# Patient Record
Sex: Male | Born: 1950 | Race: White | Hispanic: No | Marital: Married | Smoking: Never smoker
Health system: Southern US, Community
[De-identification: ages and names within clinical notes are randomized; demographics above are authoritative.]

## PROBLEM LIST (undated history)

## (undated) DIAGNOSIS — N529 Male erectile dysfunction, unspecified: Secondary | ICD-10-CM

## (undated) DIAGNOSIS — K760 Fatty (change of) liver, not elsewhere classified: Secondary | ICD-10-CM

## (undated) DIAGNOSIS — K635 Polyp of colon: Secondary | ICD-10-CM

## (undated) DIAGNOSIS — M2550 Pain in unspecified joint: Secondary | ICD-10-CM

## (undated) DIAGNOSIS — I219 Acute myocardial infarction, unspecified: Secondary | ICD-10-CM

## (undated) DIAGNOSIS — E785 Hyperlipidemia, unspecified: Secondary | ICD-10-CM

## (undated) DIAGNOSIS — I10 Essential (primary) hypertension: Secondary | ICD-10-CM

## (undated) DIAGNOSIS — I2510 Atherosclerotic heart disease of native coronary artery without angina pectoris: Secondary | ICD-10-CM

## (undated) DIAGNOSIS — K2970 Gastritis, unspecified, without bleeding: Secondary | ICD-10-CM

## (undated) DIAGNOSIS — K219 Gastro-esophageal reflux disease without esophagitis: Secondary | ICD-10-CM

## (undated) DIAGNOSIS — M1990 Unspecified osteoarthritis, unspecified site: Secondary | ICD-10-CM

## (undated) DIAGNOSIS — A048 Other specified bacterial intestinal infections: Secondary | ICD-10-CM

## (undated) DIAGNOSIS — Z87442 Personal history of urinary calculi: Secondary | ICD-10-CM

## (undated) DIAGNOSIS — I871 Compression of vein: Secondary | ICD-10-CM

## (undated) HISTORY — DX: Gastritis, unspecified, without bleeding: K29.70

## (undated) HISTORY — PX: PILONIDAL CYST EXCISION: SHX744

## (undated) HISTORY — DX: Acute myocardial infarction, unspecified: I21.9

## (undated) HISTORY — DX: Other specified bacterial intestinal infections: A04.8

---

## 2007-04-12 ENCOUNTER — Ambulatory Visit: Payer: Self-pay | Admitting: Internal Medicine

## 2010-05-29 ENCOUNTER — Ambulatory Visit: Payer: Self-pay | Admitting: Internal Medicine

## 2010-08-12 ENCOUNTER — Ambulatory Visit: Payer: Self-pay | Admitting: Internal Medicine

## 2011-02-28 IMAGING — CR DG CHEST 2V
1 series · 2 of 2 positions shown · non-contrast
Comparison: none

REASON FOR EXAM: chest congestion
COMMENTS:

[Series 1: view not recorded · 0.17mm/px · 2 of 2 slices shown]
[im 1/2]
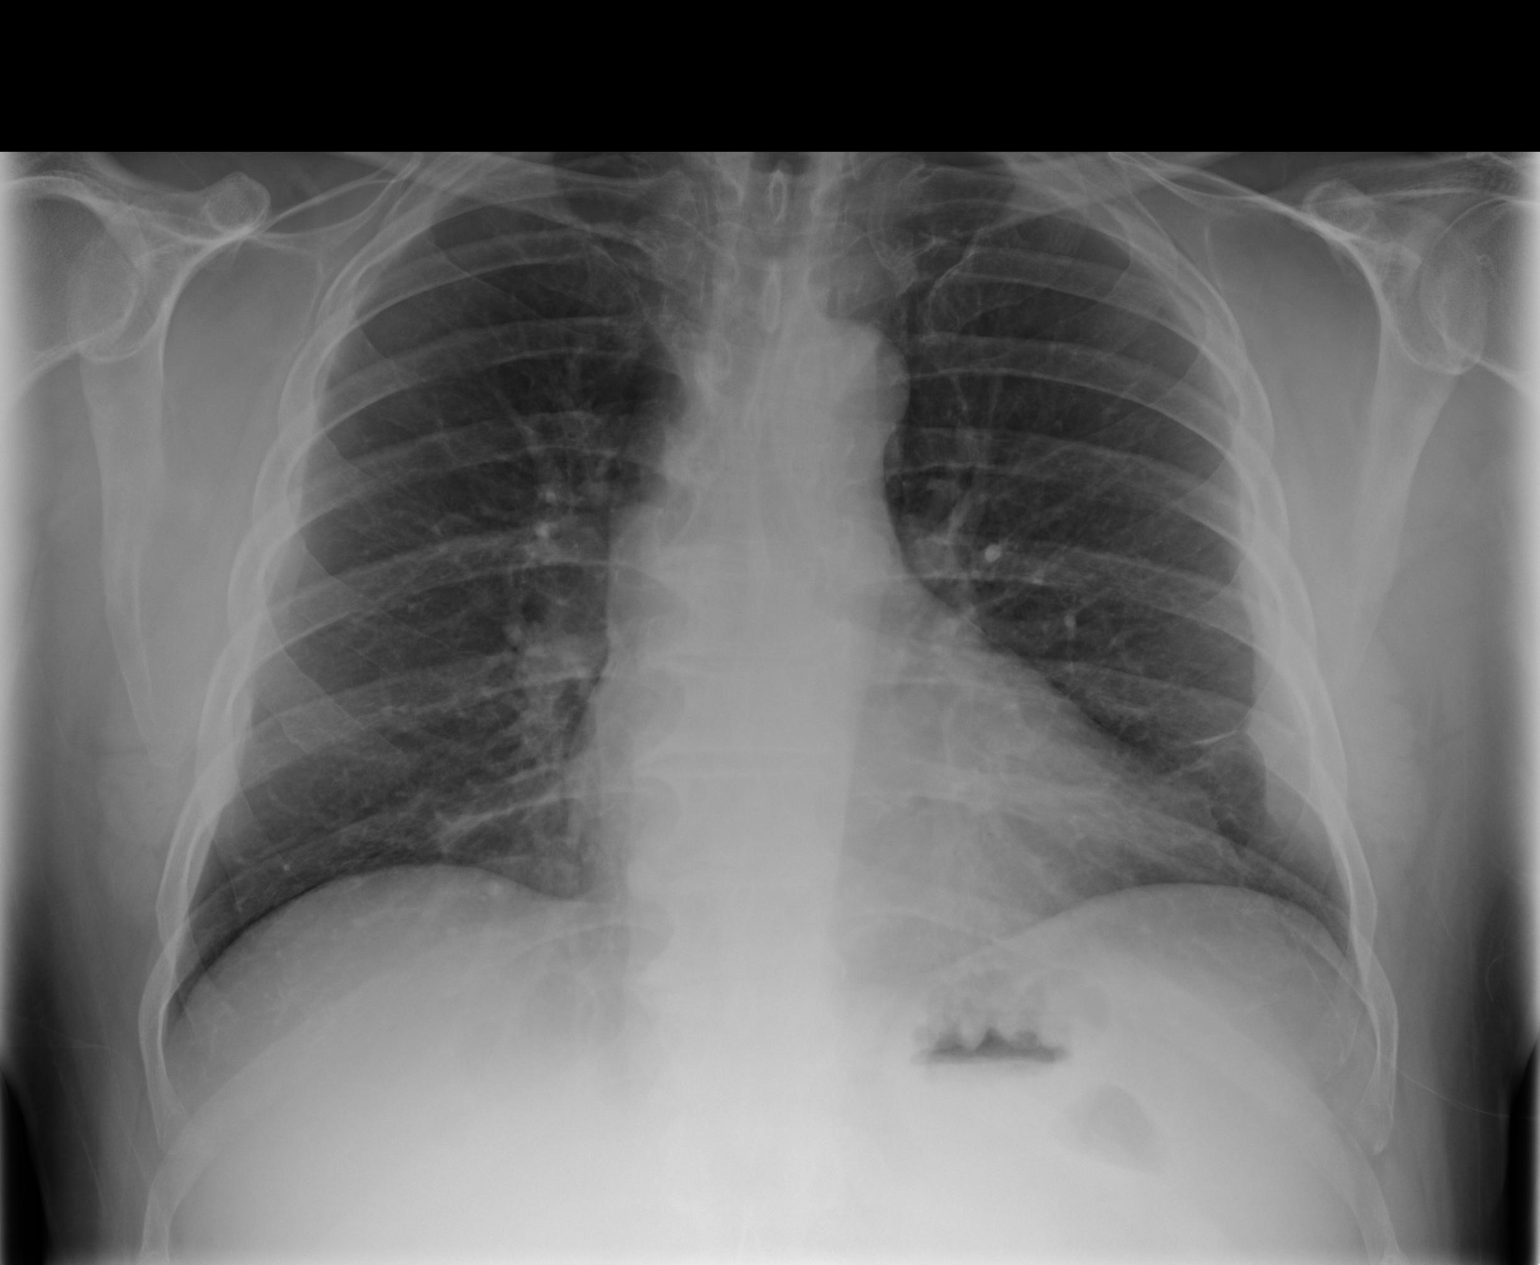
[im 2/2]
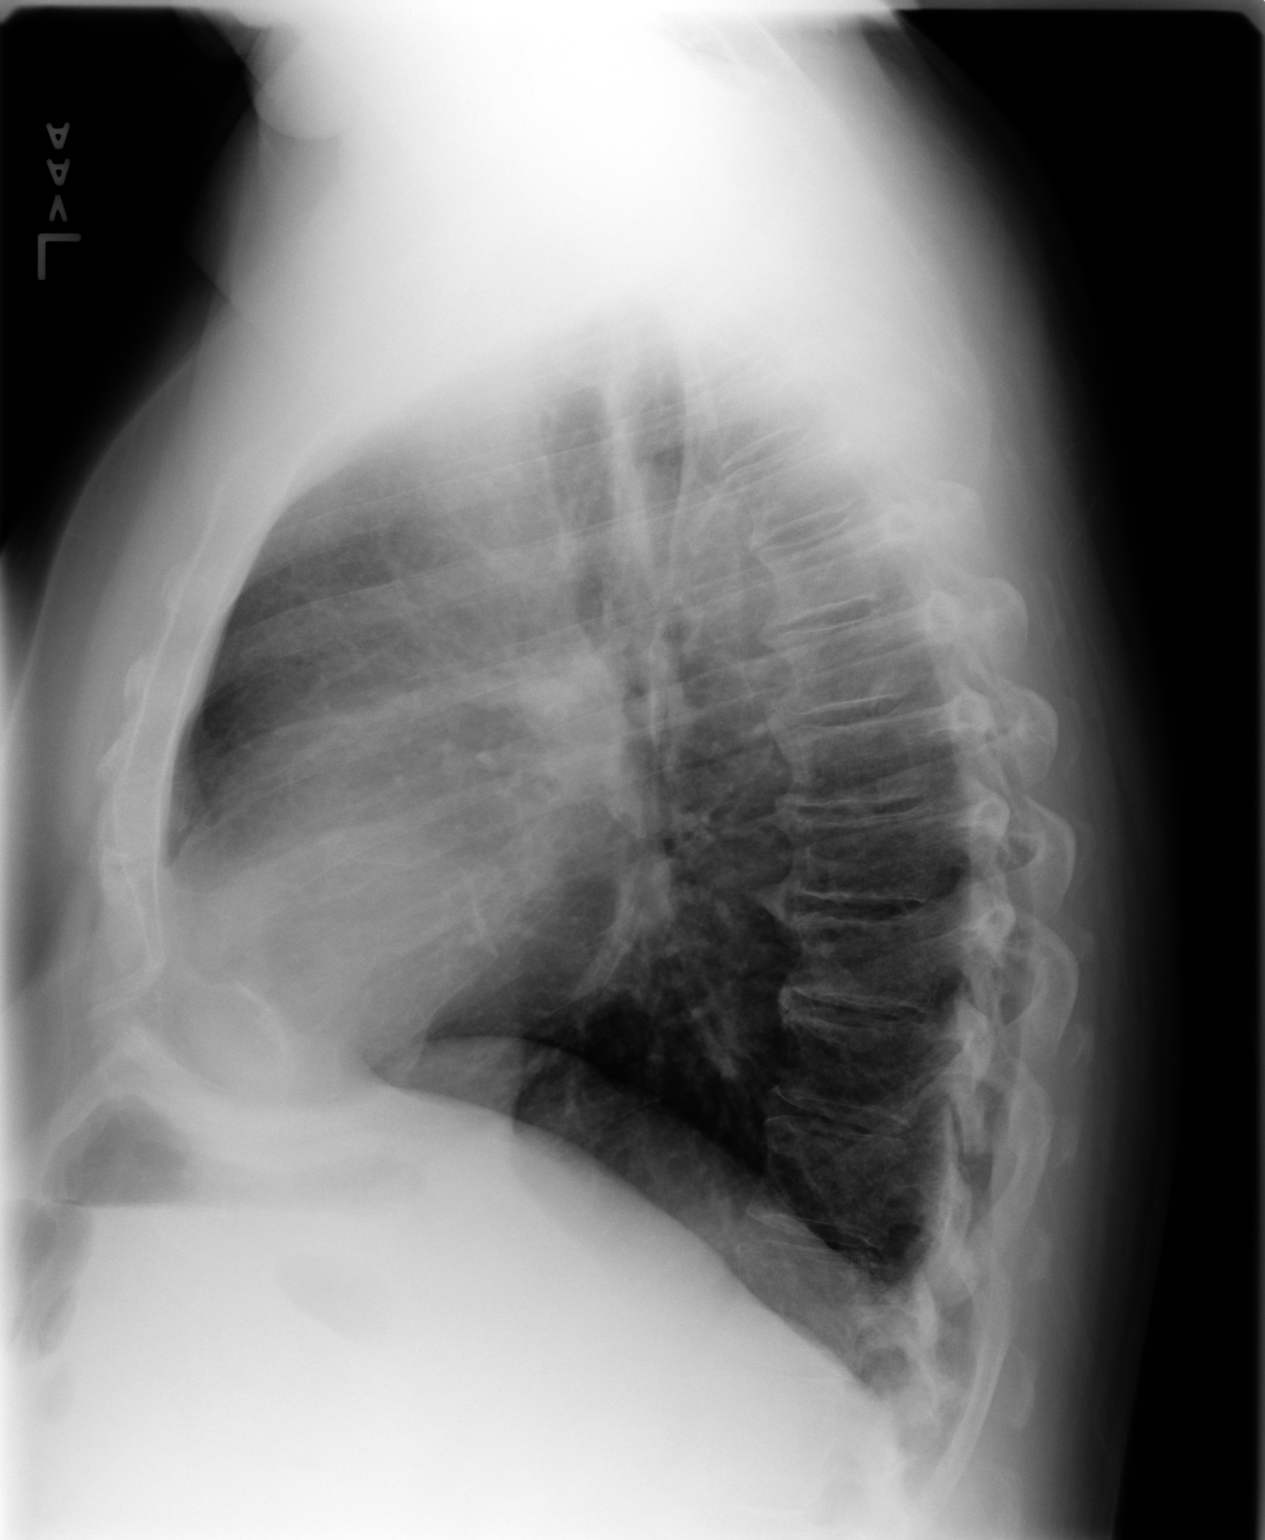

[2 of 2 positions shown; findings below may reference images not displayed]

PROCEDURE:     MDR - MDR CHEST PA(OR AP) AND LATERAL  - May 29, 2010 [DATE]

RESULT:     There is no previous exam for comparison.

The lungs are clear. The heart and pulmonary vessels are normal. The bony
and mediastinal structures are unremarkable. There is no effusion. There is
no pneumothorax or evidence of congestive failure.
IMPRESSION: No acute cardiopulmonary disease.

## 2012-12-13 ENCOUNTER — Ambulatory Visit: Payer: Self-pay | Admitting: Physician Assistant

## 2015-11-29 ENCOUNTER — Other Ambulatory Visit: Payer: Self-pay | Admitting: Nurse Practitioner

## 2015-11-29 DIAGNOSIS — K625 Hemorrhage of anus and rectum: Secondary | ICD-10-CM

## 2015-11-29 DIAGNOSIS — R1032 Left lower quadrant pain: Secondary | ICD-10-CM

## 2015-11-29 DIAGNOSIS — R1031 Right lower quadrant pain: Secondary | ICD-10-CM

## 2015-11-30 ENCOUNTER — Ambulatory Visit
Admission: RE | Admit: 2015-11-30 | Discharge: 2015-11-30 | Disposition: A | Discharge status: Home / Self Care | Payer: BC Managed Care – PPO | Source: Ambulatory Visit | Attending: Nurse Practitioner | Admitting: Nurse Practitioner

## 2015-11-30 DIAGNOSIS — R1031 Right lower quadrant pain: Secondary | ICD-10-CM

## 2015-11-30 DIAGNOSIS — I700 Atherosclerosis of aorta: Secondary | ICD-10-CM | POA: Insufficient documentation

## 2015-11-30 DIAGNOSIS — K625 Hemorrhage of anus and rectum: Secondary | ICD-10-CM

## 2015-11-30 DIAGNOSIS — K760 Fatty (change of) liver, not elsewhere classified: Secondary | ICD-10-CM | POA: Diagnosis not present

## 2015-11-30 DIAGNOSIS — R1032 Left lower quadrant pain: Secondary | ICD-10-CM

## 2015-11-30 DIAGNOSIS — I2510 Atherosclerotic heart disease of native coronary artery without angina pectoris: Secondary | ICD-10-CM | POA: Diagnosis not present

## 2015-11-30 DIAGNOSIS — K8020 Calculus of gallbladder without cholecystitis without obstruction: Secondary | ICD-10-CM | POA: Diagnosis not present

## 2015-11-30 MED ORDER — IOPAMIDOL (ISOVUE-300) INJECTION 61%
100.0000 mL | Freq: Once | INTRAVENOUS | Status: AC | PRN
  Administered 2015-11-30: 100 mL via INTRAVENOUS

## 2016-02-17 ENCOUNTER — Encounter: Payer: Self-pay | Admitting: *Deleted

## 2016-02-20 ENCOUNTER — Ambulatory Visit
Admission: RE | Admit: 2016-02-20 | Discharge: 2016-02-20 | Disposition: A | Discharge status: Home / Self Care | Payer: BC Managed Care – PPO | Source: Ambulatory Visit | Attending: Unknown Physician Specialty | Admitting: Unknown Physician Specialty

## 2016-02-20 ENCOUNTER — Encounter: Payer: Self-pay | Admitting: *Deleted

## 2016-02-20 ENCOUNTER — Encounter
Admission: RE | Disposition: A | Discharge status: Home / Self Care | Payer: Self-pay | Source: Ambulatory Visit | Attending: Unknown Physician Specialty

## 2016-02-20 ENCOUNTER — Ambulatory Visit: Payer: BC Managed Care – PPO | Admitting: Anesthesiology

## 2016-02-20 DIAGNOSIS — D123 Benign neoplasm of transverse colon: Secondary | ICD-10-CM | POA: Insufficient documentation

## 2016-02-20 DIAGNOSIS — K219 Gastro-esophageal reflux disease without esophagitis: Secondary | ICD-10-CM | POA: Diagnosis not present

## 2016-02-20 DIAGNOSIS — I2510 Atherosclerotic heart disease of native coronary artery without angina pectoris: Secondary | ICD-10-CM | POA: Insufficient documentation

## 2016-02-20 DIAGNOSIS — K3189 Other diseases of stomach and duodenum: Secondary | ICD-10-CM | POA: Insufficient documentation

## 2016-02-20 DIAGNOSIS — D120 Benign neoplasm of cecum: Secondary | ICD-10-CM | POA: Diagnosis not present

## 2016-02-20 DIAGNOSIS — I252 Old myocardial infarction: Secondary | ICD-10-CM | POA: Diagnosis not present

## 2016-02-20 DIAGNOSIS — D122 Benign neoplasm of ascending colon: Secondary | ICD-10-CM | POA: Diagnosis not present

## 2016-02-20 DIAGNOSIS — E785 Hyperlipidemia, unspecified: Secondary | ICD-10-CM | POA: Diagnosis not present

## 2016-02-20 DIAGNOSIS — Z7982 Long term (current) use of aspirin: Secondary | ICD-10-CM | POA: Diagnosis not present

## 2016-02-20 DIAGNOSIS — K766 Portal hypertension: Secondary | ICD-10-CM | POA: Diagnosis not present

## 2016-02-20 DIAGNOSIS — M1990 Unspecified osteoarthritis, unspecified site: Secondary | ICD-10-CM | POA: Diagnosis not present

## 2016-02-20 DIAGNOSIS — I10 Essential (primary) hypertension: Secondary | ICD-10-CM | POA: Insufficient documentation

## 2016-02-20 DIAGNOSIS — K2970 Gastritis, unspecified, without bleeding: Secondary | ICD-10-CM | POA: Diagnosis not present

## 2016-02-20 DIAGNOSIS — K621 Rectal polyp: Secondary | ICD-10-CM | POA: Diagnosis not present

## 2016-02-20 HISTORY — DX: Acute myocardial infarction, unspecified: I21.9

## 2016-02-20 HISTORY — PX: ESOPHAGOGASTRODUODENOSCOPY (EGD) WITH PROPOFOL: SHX5813

## 2016-02-20 HISTORY — PX: COLONOSCOPY WITH PROPOFOL: SHX5780

## 2016-02-20 HISTORY — DX: Male erectile dysfunction, unspecified: N52.9

## 2016-02-20 HISTORY — DX: Hyperlipidemia, unspecified: E78.5

## 2016-02-20 HISTORY — DX: Compression of vein: I87.1

## 2016-02-20 HISTORY — DX: Essential (primary) hypertension: I10

## 2016-02-20 HISTORY — DX: Gastro-esophageal reflux disease without esophagitis: K21.9

## 2016-02-20 HISTORY — DX: Unspecified osteoarthritis, unspecified site: M19.90

## 2016-02-20 HISTORY — DX: Fatty (change of) liver, not elsewhere classified: K76.0

## 2016-02-20 HISTORY — DX: Polyp of colon: K63.5

## 2016-02-20 HISTORY — DX: Atherosclerotic heart disease of native coronary artery without angina pectoris: I25.10

## 2016-02-20 HISTORY — DX: Pain in unspecified joint: M25.50

## 2016-02-20 SURGERY — COLONOSCOPY WITH PROPOFOL
Anesthesia: General

## 2016-02-20 MED ORDER — PROPOFOL 10 MG/ML IV BOLUS
INTRAVENOUS | Status: DC | PRN
  Administered 2016-02-20 (×2): 20 mg via INTRAVENOUS
  Administered 2016-02-20: 60 mg via INTRAVENOUS

## 2016-02-20 MED ORDER — LIDOCAINE HCL (CARDIAC) 20 MG/ML IV SOLN
INTRAVENOUS | Status: DC | PRN
  Administered 2016-02-20: 100 mg via INTRAVENOUS

## 2016-02-20 MED ORDER — PROPOFOL 500 MG/50ML IV EMUL
INTRAVENOUS | Status: DC | PRN
Start: 2016-02-20 — End: 2016-02-20
  Administered 2016-02-20: 140 ug/kg/min via INTRAVENOUS

## 2016-02-20 MED ORDER — SODIUM CHLORIDE 0.9 % IV SOLN
INTRAVENOUS | Status: DC
  Administered 2016-02-20: 13:00:00 via INTRAVENOUS

## 2016-02-20 MED ORDER — SODIUM CHLORIDE 0.9 % IV SOLN: INTRAVENOUS | Status: DC

## 2016-02-20 NOTE — Transfer of Care (Signed)
Immediate Anesthesia Transfer of Care Note  Patient: Ricky Stewart  Procedure(s) Performed: Procedure(s): COLONOSCOPY WITH PROPOFOL (N/A) ESOPHAGOGASTRODUODENOSCOPY (EGD) WITH PROPOFOL (N/A)  Patient Location: PACU  Anesthesia Type:General  Level of Consciousness: awake, alert  and oriented  Airway & Oxygen Therapy: Patient Spontanous Breathing and Patient connected to nasal cannula oxygen  Post-op Assessment: Report given to RN and Post -op Vital signs reviewed and stable  Post vital signs: Reviewed and stable  Last Vitals:  Vitals:   02/20/16 1248  BP: 135/89  Pulse: 70  Resp: 18  Temp: 36.6 C    Last Pain:  Vitals:   02/20/16 1249  TempSrc:   PainSc: 3          Complications: No apparent anesthesia complications

## 2016-02-20 NOTE — H&P (Signed)
   Primary Care Physician:  Mertie Clause, MD Primary Gastroenterologist:  Dr. Vira Agar  Pre-Procedure History & Physical: HPI:  Ricky Stewart is a 65 y.o. male is here for an endoscopy and colonoscopy.   Past Medical History:  Diagnosis Date  . Arthralgia   . Arthritis   . Colon polyp   . Coronary artery disease   . ED (erectile dysfunction)   . Fatty liver   . GERD (gastroesophageal reflux disease)   . Hepatic vein stenosis   . Hyperlipidemia   . Hypertension   . Myocardial infarction     History reviewed. No pertinent surgical history.  Prior to Admission medications   Medication Sig Start Date End Date Taking? Authorizing Provider  aspirin EC 81 MG tablet Take 81 mg by mouth daily.   Yes Historical Provider, MD  atorvastatin (LIPITOR) 40 MG tablet Take 40 mg by mouth daily.   Yes Historical Provider, MD  losartan (COZAAR) 100 MG tablet Take 100 mg by mouth daily.   Yes Historical Provider, MD  niacin 100 MG tablet Take 100 mg by mouth at bedtime.   Yes Historical Provider, MD  Omega-3 Fatty Acids (FISH OIL PO) Take by mouth.   Yes Historical Provider, MD  omeprazole (PRILOSEC) 20 MG capsule Take 20 mg by mouth daily.    Historical Provider, MD    Allergies as of 02/01/2016  . (No Known Allergies)    History reviewed. No pertinent family history.  Social History   Social History  . Marital status: Married    Spouse name: N/A  . Number of children: N/A  . Years of education: N/A   Occupational History  . Not on file.   Social History Main Topics  . Smoking status: Never Smoker  . Smokeless tobacco: Never Used  . Alcohol use 0.6 oz/week    1 Cans of beer per week  . Drug use: No  . Sexual activity: Not on file   Other Topics Concern  . Not on file   Social History Narrative  . No narrative on file    Review of Systems: See HPI, otherwise negative ROS  Physical Exam: BP 135/89   Pulse 70   Temp 97.9 F (36.6 C) (Tympanic)   Resp 18   Ht  5\' 8"  (1.727 m)   Wt 99.8 kg (220 lb)   SpO2 98%   BMI 33.45 kg/m  General:   Alert,  pleasant and cooperative in NAD Head:  Normocephalic and atraumatic. Neck:  Supple; no masses or thyromegaly. Lungs:  Clear throughout to auscultation.    Heart:  Regular rate and rhythm. Abdomen:  Soft, nontender and nondistended. Normal bowel sounds, without guarding, and without rebound.   Neurologic:  Alert and  oriented x4;  grossly normal neurologically.  Impression/Plan: Ilean Skill is here for an endoscopy and colonoscopy to be performed for rectal bleeding, heme pos stool, PH adenomatous polyps, GERD.  Risks, benefits, limitations, and alternatives regarding  endoscopy and colonoscopy have been reviewed with the patient.  Questions have been answered.  All parties agreeable.   Gaylyn Cheers, MD  02/20/2016, 1:20 PM

## 2016-02-20 NOTE — Op Note (Addendum)
[facility] Gastroenterology Patient Name: Ricky Stewart Procedure Date: 02/20/2016 1:24 PM MRN: [id number] Account #: [id number] Date of Birth: 1950-09-14 Admit Type: Outpatient Age: 65 Room: [facility] ENDO ROOM 4 Gender: Male Note Status: Finalized Procedure:            Colonoscopy Indications:          Heme positive stool, Rectal bleeding Providers:            Manya Silvas, MD Referring MD:         Rogue Jury, MD (Referring MD) Medicines:            Propofol per Anesthesia Complications:        No immediate complications. Procedure:            Pre-Anesthesia Assessment:                       - After reviewing the risks and benefits, the patient                        was deemed in satisfactory condition to undergo the                        procedure.                       After obtaining informed consent, the colonoscope was                        passed under direct vision. Throughout the procedure,                        the patient's blood pressure, pulse, and oxygen                        saturations were monitored continuously. The                        Colonoscope was introduced through the anus and                        advanced to the the cecum, identified by appendiceal                        orifice and ileocecal valve. The colonoscopy was                        performed without difficulty. The patient tolerated the                        procedure well. The quality of the bowel preparation                        was excellent. Findings:      Two sessile polyps were found in the cecum. The polyps were diminutive       in size. These polyps were removed with a jumbo cold forceps. Resection       and retrieval were complete.      A diminutive polyp was found in the ascending colon. The polyp was       sessile. The polyp was removed with a jumbo cold forceps. Resection and  retrieval were complete.      A diminutive polyp was found in the  transverse colon. The polyp was       sessile. The polyp was removed with a cold snare. Resection and       retrieval were complete.      A 6-8 mm polyp was found in the ano-rectum. The polyp was       semi-pedunculated. It was attached in an area of internal hemorrhoids.       Will need an anorectal surgeon to remove this. Impression:           - Two diminutive polyps in the cecum, removed with a                        jumbo cold forceps. Resected and retrieved.                       - One diminutive polyp in the ascending colon, removed                        with a jumbo cold forceps. Resected and retrieved.                       - One diminutive polyp in the transverse colon, removed                        with a cold snare. Resected and retrieved.                       - One 8 mm polyp in the rectum. Recommendation:       - Await pathology results. Manya Silvas, MD 02/20/2016 2:09:11 PM This report has been signed electronically. Number of Addenda: 0 Note Initiated On: 02/20/2016 1:24 PM Scope Withdrawal Time: 0 hours 17 minutes 46 seconds  Total Procedure Duration: 0 hours 20 minutes 55 seconds       [facility]

## 2016-02-20 NOTE — Anesthesia Preprocedure Evaluation (Addendum)
Anesthesia Evaluation  Patient identified by MRN, date of birth, ID band Patient awake    Reviewed: Allergy & Precautions, NPO status , Patient's Chart, lab work & pertinent test results  History of Anesthesia Complications Negative for: history of anesthetic complications  Airway Mallampati: II  TM Distance: >3 FB Neck ROM: Full    Dental  (+) Partial Lower   Pulmonary neg pulmonary ROS, neg sleep apnea, neg COPD,    breath sounds clear to auscultation- rhonchi (-) wheezing      Cardiovascular hypertension, Pt. on medications (-) angina+ CAD and + Past MI (1999)  (-) Cardiac Stents and (-) CABG  Rhythm:Regular Rate:Normal - Systolic murmurs and - Diastolic murmurs  Cardiac Cath [facility] 01/2007 RCA PDA 90%. D1 80%. D2 80%. Normal EF. 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 No date: Hyperlipidemia No date: Hypertension No date: Myocardial infarction   Reproductive/Obstetrics                            Anesthesia Physical Anesthesia Plan  ASA: III  Anesthesia Plan: General   Post-op Pain Management:    Induction: Intravenous  Airway Management Planned: Natural Airway  Additional Equipment:   Intra-op Plan:   Post-operative Plan:   Informed Consent: I have reviewed the patients History and Physical, chart,  labs and discussed the procedure including the risks, benefits and alternatives for the proposed anesthesia with the patient or authorized representative who has indicated his/her understanding and acceptance.   Dental advisory given  Plan Discussed with: CRNA and Anesthesiologist  Anesthesia Plan Comments:         Anesthesia Quick Evaluation

## 2016-02-20 NOTE — Op Note (Signed)
[facility] Gastroenterology Patient Name: Ricky Stewart Procedure Date: 02/20/2016 1:24 PM MRN: [id number] Account #: [id number] Date of Birth: 1950-05-05 Admit Type: Outpatient Age: 65 Room: [facility] ENDO ROOM 4 Gender: Male Note Status: Finalized Procedure:            Upper GI endoscopy Indications:          Heartburn, Heme positive stool Providers:            Manya Silvas, MD Referring MD:         Rogue Jury, MD (Referring MD) Medicines:            Propofol per Anesthesia Complications:        No immediate complications. Procedure:            Pre-Anesthesia Assessment:                       - After reviewing the risks and benefits, the patient                        was deemed in satisfactory condition to undergo the                        procedure.                       After obtaining informed consent, the endoscope was                        passed under direct vision. Throughout the procedure,                        the patient's blood pressure, pulse, and oxygen                        saturations were monitored continuously. The Endoscope                        was introduced through the mouth, and advanced to the                        second part of duodenum. The upper GI endoscopy was                        accomplished without difficulty. The patient tolerated                        the procedure well. Findings:      The examined esophagus was normal.      Diffuse mild inflammation characterized by erythema and granularity was       found in the gastric body and in the gastric antrum. Biopsies were taken       with a cold forceps for histology. Biopsies were taken with a cold       forceps for Helicobacter pylori testing.      Mild portal hypertensive gastropathy was found in the gastric body.      The examined duodenum was normal. Impression:           - Normal esophagus.                       - Gastritis. Biopsied.                       -  Portal hypertensive gastropathy.                       - Normal examined duodenum. Recommendation:       - Await pathology results. Manya Silvas, MD 02/20/2016 1:37:30 PM This report has been signed electronically. Number of Addenda: 0 Note Initiated On: 02/20/2016 1:24 PM      [facility]

## 2016-02-21 ENCOUNTER — Encounter: Payer: Self-pay | Admitting: Unknown Physician Specialty

## 2016-02-21 NOTE — Anesthesia Postprocedure Evaluation (Signed)
Anesthesia Post Note  Patient: Ricky Stewart  Procedure(s) Performed: Procedure(s) (LRB): COLONOSCOPY WITH PROPOFOL (N/A) ESOPHAGOGASTRODUODENOSCOPY (EGD) WITH PROPOFOL (N/A)  Patient location during evaluation: Endoscopy Anesthesia Type: General Level of consciousness: awake and alert and oriented Pain management: pain level controlled Vital Signs Assessment: post-procedure vital signs reviewed and stable Respiratory status: spontaneous breathing, nonlabored ventilation and respiratory function stable Cardiovascular status: blood pressure returned to baseline and stable Postop Assessment: no signs of nausea or vomiting Anesthetic complications: no    Last Vitals:  Vitals:   02/20/16 1406 02/20/16 1436  BP: (!) 134/99 (!) 124/94  Pulse:    Resp: 16   Temp: (!) 36 C     Last Pain:  Vitals:   02/20/16 1406  TempSrc: Tympanic  PainSc:                  [name]

## 2016-02-22 LAB — SURGICAL PATHOLOGY

## 2016-03-01 ENCOUNTER — Encounter: Payer: Self-pay | Admitting: General Surgery

## 2016-03-01 ENCOUNTER — Ambulatory Visit (INDEPENDENT_AMBULATORY_CARE_PROVIDER_SITE_OTHER): Payer: BC Managed Care – PPO | Admitting: General Surgery

## 2016-03-01 VITALS — BP 128/74 | HR 68 | Resp 14 | Ht 68.0 in | Wt 227.0 lb

## 2016-03-01 DIAGNOSIS — A048 Other specified bacterial intestinal infections: Secondary | ICD-10-CM | POA: Insufficient documentation

## 2016-03-01 DIAGNOSIS — K620 Anal polyp: Secondary | ICD-10-CM | POA: Diagnosis not present

## 2016-03-01 DIAGNOSIS — K621 Rectal polyp: Secondary | ICD-10-CM | POA: Diagnosis not present

## 2016-03-01 MED ORDER — CLARITHROMYCIN 500 MG PO TABS: 500.0000 mg | ORAL_TABLET | Freq: Two times a day (BID) | ORAL | 0 refills | Status: AC

## 2016-03-01 MED ORDER — OMEPRAZOLE 40 MG PO CPDR
40.0000 mg | DELAYED_RELEASE_CAPSULE | Freq: Two times a day (BID) | ORAL | 0 refills | Status: AC
Start: 2016-03-01 — End: ?

## 2016-03-01 MED ORDER — AMOXICILLIN 500 MG PO TABS: 1000.0000 mg | ORAL_TABLET | Freq: Two times a day (BID) | ORAL | 0 refills | Status: DC

## 2016-03-01 NOTE — Progress Notes (Signed)
Patient ID: Ricky Stewart, male   DOB: October 22, 1950, 65 y.o.   MRN: [id number]  Chief Complaint  Patient presents with  . Other    rectal polyp    HPI Ricky Stewart is a 64 y.o. male here today following up from a colonoscopy which was done on 02/20/2016 by Dr. Vira Agar. Patient states in the middle of the night he wakes up to move his bowels and having gassy pain .  He feels hurgery all the time.  Patient path report from his recently colonoscopy showed an several tubular adenomas.  Upper GI biopsy showed evidence of H. pylori. The patient reports he was not able to get in to see the PA from the GI physicians office until January. Moves his bowels four times daily. HPI  Past Medical History:  Diagnosis Date  . Arthralgia   . Arthritis   . Colon polyp   . Coronary artery disease   . ED (erectile dysfunction)   . Fatty liver   . Gastritis   . GERD (gastroesophageal reflux disease)   . Heart attack    18 years ago.  Marland Kitchen Helicobacter pylori infection   . Hepatic vein stenosis   . Hyperlipidemia   . Hypertension   . Myocardial infarction     Past Surgical History:  Procedure Laterality Date  . COLONOSCOPY WITH PROPOFOL N/A 02/20/2016   Procedure: COLONOSCOPY WITH PROPOFOL;  Surgeon: Manya Silvas, MD;  Location: [facility] ENDOSCOPY;  Service: Endoscopy;  Laterality: N/A;  . ESOPHAGOGASTRODUODENOSCOPY (EGD) WITH PROPOFOL N/A 02/20/2016   Procedure: ESOPHAGOGASTRODUODENOSCOPY (EGD) WITH PROPOFOL;  Surgeon: Manya Silvas, MD;  Location: [facility] ENDOSCOPY;  Service: Endoscopy;  Laterality: N/A;    No family history on file.  Social History Social History  Substance Use Topics  . Smoking status: Never Smoker  . Smokeless tobacco: Never Used  . Alcohol use 0.6 oz/week    1 Cans of beer per week    No Known Allergies  Current Outpatient Prescriptions  Medication Sig Dispense Refill  . aspirin EC 81 MG tablet Take 81 mg by mouth daily.    Marland Kitchen atorvastatin (LIPITOR) 40 MG tablet Take  40 mg by mouth daily.    Marland Kitchen losartan (COZAAR) 100 MG tablet Take 100 mg by mouth daily.    . niacin 100 MG tablet Take 100 mg by mouth at bedtime.    . Omega-3 Fatty Acids (FISH OIL PO) Take by mouth.    Marland Kitchen amoxicillin (AMOXIL) 500 MG tablet Take 2 tablets (1,000 mg total) by mouth 2 (two) times daily. 60 tablet 0  . clarithromycin (BIAXIN) 500 MG tablet Take 1 tablet (500 mg total) by mouth 2 (two) times daily. 28 tablet 0  . omeprazole (PRILOSEC) 40 MG capsule Take 1 capsule (40 mg total) by mouth 2 (two) times daily. 60 capsule 0   No current facility-administered medications for this visit.     Review of Systems Review of Systems  Blood pressure 128/74, pulse 68, resp. rate 14, height 5\' 8"  (1.727 m), weight 227 lb (103 kg).  Physical Exam Physical Exam  Constitutional: He is oriented to person, place, and time. He appears well-developed and well-nourished.  Eyes: Conjunctivae are normal. No scleral icterus.  Neck: Neck supple.  Cardiovascular: Normal rate, regular rhythm and normal heart sounds.   Pulmonary/Chest: Effort normal and breath sounds normal.  Abdominal: Soft. Normal appearance and bowel sounds are normal. There is hepatosplenomegaly. There is no hepatomegaly. There is no tenderness. No hernia.  Genitourinary: Rectal exam shows internal hemorrhoid.     Lymphadenopathy:    He has no cervical adenopathy.       Right: No inguinal adenopathy present.       Left: No inguinal and no supraclavicular adenopathy present.  Neurological: He is alert and oriented to person, place, and time.  Skin: Skin is warm and dry.    Data Reviewed Colonoscopy and upper endoscopy of 02/20/2016 reviewed.  Marland Kitchen STOMACH, ANTRUM AND BODY; COLD BIOPSY:  - ANTRAL AND OXYNTIC MUCOSA WITH MODERATE CHRONIC ACTIVE GASTRITIS,  HELICOBACT[employer] PYLORI [employer]IATED.  - NEGATIVE FOR DYSPLASIA AND MALIGNANCY.   B. COLON POLYP 2, CECUM; COLD BIOPSY:  - TUBULAR ADENOMAS (2).  - NEGATIVE FOR HIGH-GRADE  DYSPLASIA AND MALIGNANCY.   C. COLON POLYP, ASCENDING; COLD BIOPSY:  - TUBULAR ADENOMA.  - NEGATIVE FOR HIGH-GRADE DYSPLASIA AND MALIGNANCY.   D. COLON POLYP, TRANSVERSE; COLD SNARE/BIOPSY:  - TUBULAR ADENOMA.  - NEGATIVE FOR HIGH-GRADE DYSPLASIA AND MALIGNANCY.     Assessment    Documented H. pylori infection.  Anal polyp like lesions inflammatory rather than adenomatous adjacent to a large internal hemorrhoid.     Plan    The patient was prescribed twice a day Prilosec, Biaxin and amoxicillin for 2 weeks to treat his H. pylori infection.    Stop taking the Lipitor while taking the antibiotics.    To obtain pathology on the anal polyp, formal excision will be required. Depending on exam at the time of surgery, hemorrhoidal banding may be undertaken at the same setting.  Patient to be scheduled for polyp removal and hemorrhoid banding. The patient will be encouraged to make use of a daily fiber supplement.   Patient's surgery has been scheduled for 03-22-16 at [facility]. This patient has been asked to do one fleets enema one hour prior to arrival at [facility].   This information has been scribed by Gaspar Cola CMA.  [name] 03/01/2016, 7:55 PM

## 2016-03-01 NOTE — Patient Instructions (Addendum)
Stop taking the Lipitor while taking the antibiotics.   Patient to be scheduled for polyp removal and hemorrhoid banding. The patient will be encouraged to make use of a daily fiber supplement.

## 2016-03-13 ENCOUNTER — Encounter
Admission: RE | Admit: 2016-03-13 | Discharge: 2016-03-13 | Disposition: A | Discharge status: Home / Self Care | Payer: BC Managed Care – PPO | Source: Ambulatory Visit | Attending: General Surgery | Admitting: General Surgery

## 2016-03-13 HISTORY — DX: Personal history of urinary calculi: Z87.442

## 2016-03-13 NOTE — Pre-Procedure Instructions (Signed)
STRESS ECHO6/07/2011 [place]  [id number]  CARDIAC DIAGNOSTIC UNIT Date: 09/25/2011 13:00:00  Adult Male Age: 65  STRESS ECHO REPORTOutpatient  MPDC ---------------------------------------------------- MD1: Vevelyn Pat A STUDY: Stress Echo TAPE: [id number] BP: 138/98  ECHO: Yes DOPPLER: YesFILE: [id number]: HR: 70 COLOR: YesCONTRAST: Yes MACHINE: IE33 #7 Height: 68 in RV BIOPSY: No 3D: No SOUND QLTY: PoorWeight: 204 lbs  MEDIUM: DefinityBSA: 2.11,BMI: 31.00 ------------------------------------------------------------------------------  HISTORY:Coronary Artery Disease REASON: Assess LV function INDICATION: 780.8 - Generalized hyperhidrosis.  STRESS ECHOCARDIOGRAPHY ------------------------------------------------------   Protocol: Treadmill (Bruce) Drugs: None Target Heart Rate: 136Maximum Predicted Heart Rate: 161Resting EF: >55%  TYPE STAGETIMEHRBP DOSE COMMENTS -------- ----- --------- --- ------- ---------- ------------------------------ Baseline70 138/ 98Standing BP 165/84; HR 78 Stress 1180 sec.95 192/ 860 Definity 36ml via IV push Stress 2180 sec. 0 181/1010 RPE 13/20 Stress 3180 sec. 130 172/ 980 RPE 15/20 Stress 4 50 sec. 150 0/00 Recovery 11 min. 129 202/ 940 Recovery 22 min. 109 202/1110 Recovery 34 min.94 196/ 990 Recovery 46 min.90 187/ 990 Recovery  58 min.82 165/1040 Recovery 6 10 min.80 197/ 970  Stress Duration: 9.83 minutes.Max Stress H.R: 150  WALL SEGMENT CHANGES ---------------------------------------------------------   RestStress  --------------- --------------- Anterior Septum: NormalHyperkinetic Anterior Wall: NormalHyperkinetic  Lateral Wall: NormalHyperkinetic  Posterior Wall: NormalHyperkinetic Inferior Wall: NormalHyperkinetic Inferior Septum: NormalHyperkinetic  Apex: NormalHyperkinetic  MR: No MR N/A N/A N/A LVOT Vel: LVOT Jet: N/A  ADDITIONAL FINDINGS ----------------------------------------------------------  Chest Discomfort: None Arrhythmia: None Termination Reason: Fatigue  Adverse Effects: None and None  Complications: None  STRESS ECG RESULTS -----------------------------------------------------------   ECG Results: Normal  INTERPRETATION ---------------------------------------------------------------  Interpretation: Normal Stress Test. NO SIGNIFICANT VALVE ABNORMALITIES. Note: Excellent exercise tolerance with 13.4 METs max exertion Grade I diastolic dysfunction   (Report version 4.0)Interpreted and Electronically signed  Perform. by: Geryl Rankins, RRT, [facility] by: Leotis Shames, MD  Resp.Person: Geryl Rankins, RRT, [facility] On: 09/25/2011 18:08:32  Resp. Staff: Lars Mage, RN

## 2016-03-13 NOTE — Pre-Procedure Instructions (Signed)
Stewart, Ricky Coop, MD - 01/13/2016 1:20 PM EDT Formatting of this note may be different from the original. Ricky Stewart 01/13/2016 DOB: 1950/08/10 Age: 65 y.o. Clinic Note - Cardiovascular Ricky Stewart M.D. Ph.D. PRIMARY CARE PROVIDER:  Caleen Essex, III, MD  PATIENT PROFILE:  Ricky Stewart is a 65 y.o. male who comes in for consultation regarding coronary artery disease.  PROBLEM LIST:  Problem List Date Reviewed: 25-Jan-2016  Codes Priority Class Noted - Resolved  Pure hypercholesterolemia ICD-10-CM: E78.00 ICD-9-CM: 272.0 High Unknown - Present  Overview Addendum January 25, 2016 12:18 PM by Ricky Coop Povsic, MD  Sept 2017: Tchol 167, HDL 64, LDL 73, TG 153 07/2010: TC 153 TG 84 HDL 64 LDL 72   CAD (coronary artery disease) ICD-10-CM: I25.10 ICD-9-CM: 414.00 High Unknown - Present  Overview Signed 08/15/2011 2:40 PM by Ricky Canterbury, RN  a. 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 No intervention was performed. Stress Test ordered by Dr. Caryl Comes at [facility] in 2008 with Stewart protocol with peak heart rate of 148 and no EKG changes but mild mid inferolateral and epical lateral ischemia with an EF above 60.  b. Cardiac Cath [facility] 01/2007 RCA PDA 90%. D1 80%. D2 80%. Normal EF. Elected to treat these lesions medically   Essential hypertension ICD-10-CM: I10 ICD-9-CM: 401.9 Medium Unknown - Present  Fatty liver ICD-10-CM: K76.0 ICD-9-CM: 571.8 12/13/2015 - Present  Bilateral lower abdominal pain ICD-10-CM: R10.31, R10.32 ICD-9-CM: 789.03, 789.04 11/29/2015 - Present  Bright red rectal bleeding ICD-10-CM: K62.5 ICD-9-CM: 569.3 11/29/2015 - Present  Bowel habit changes ICD-10-CM: R19.4 ICD-9-CM: 787.99 11/29/2015 - Present  History of adenomatous polyp of colon ICD-10-CM: Z86.010 ICD-9-CM: V12.72 11/29/2015 - Present  Heme positive stool ICD-10-CM:  R19.5 ICD-9-CM: 792.1 11/29/2015 - Present  Acid reflux ICD-10-CM: K21.9 ICD-9-CM: 530.81 01/12/2014 - Present  Allergic rhinitis ICD-10-CM: J30.9 ICD-9-CM: 477.9 01/12/2014 - Present  Neck pain ICD-10-CM: M54.2 ICD-9-CM: 723.1 02/24/2013 - Present  Chronic low back pain ICD-10-CM: M54.5, G89.29 ICD-9-CM: 724.2, 338.29 12/07/2011 - Present  Routine general medical examination at a health care facility ICD-10-CM: Z00.00 ICD-9-CM: V70.0 09/10/2011 - Present  Vitamin D deficiency ICD-10-CM: E55.9 ICD-9-CM: 268.9 09/10/2011 - Present  Abnormal blood sugar ICD-10-CM: R73.09 ICD-9-CM: 790.29 09/10/2011 - Present  Night sweats ICD-10-CM: R61 ICD-9-CM: 780.8 09/10/2011 - Present  Arthralgia, unspecified ICD-10-CM: M25.50 ICD-9-CM: 719.40 Unknown - Present  ED (erectile dysfunction) ICD-10-CM: N52.9 ICD-9-CM: 607.84 Unknown - Present  Low testosterone ICD-10-CM: E29.1 ICD-9-CM: 257.2 Unknown - Present  RESOLVED: Abdominal pain, LLQ (left lower quadrant) ICD-10-CM: R10.32 ICD-9-CM: 789.04 12/13/2015 - 12/13/2015  RESOLVED: Adenomatous colon polyp ICD-10-CM: D12.6 ICD-9-CM: 211.3 09/10/2011 - 11/29/2015  RESOLVED: Routine general medical examination at a health care facility ICD-10-CM: Z00.00 ICD-9-CM: V70.0 07/25/2011 - 08/15/2011    HISTORY:  [name] was last seen in 2012. At that time he was doing well without angina for his coronary disease which was diagnosed in a cath in 2008. He had an MI at a different institution when he was in his 45s and this was described as an inferior MI with an occluded PDA. We first saw him in 2008 when he presented after getting an abnormal stress test. We elected to pursue cardiac catheterization which showed tight lesions in small diagonal branches as well as a lesion in the PDA which was also small and he was treated medically.  We last saw him in 2012.  He comes in for routine follow-up I think at the behest with GI doctors who want to further evaluate some abdominal  pain and bloating that he has had and I guess they wanted his heart checked out first. He says he feels pretty good. He is very active he is still working as a Games developer and recently completed a project where he was carrying large ports of what up 18-20 steps. He is not sure he is going to do projects like this anymore but he denies any chest pain or shortness of breath. His main difficulty is that he has arthritis. He says if he sits for half an hour in his car it takes him several minutes to get up and moving again. He denies orthopnea PND or edema.  He really enjoyed being in a stress management study that was run by his friend Dr. Verl Stewart. He has been contacted about a similar study recently. I have encouraged him if it is and is scheduled that this would be a good idea for him.  He had a CAT scan recently which some and disease and had quit drinking. 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 He said it would dull the pain of all his arthritis. He has not had a drink in 2 months and has lost about 5 pounds and feels really good. He says he comes from [place] family that were all heavy drinkers and in previous generations the wife would bring a drink to the man when he came home from work.  MEDICATIONS: Current Outpatient Prescriptions  Medication Sig Dispense Refill  . aspirin 81 mg tablet 1 tab by mouth daily  . atorvastatin (LIPITOR) 80 MG tablet Take 1 tablet (80 mg total) by mouth once daily. 90 tablet 11  . DOCOSAHEXANOIC ACID/EPA (FISH OIL ORAL) Take by mouth once daily.  Marland Kitchen losartan (COZAAR) 100 MG tablet Take 1 tablet (100 mg total) by mouth once daily. 90 tablet 11  . niacin (NIASPAN) 1000 MG ER tablet Take 1,000 mg by mouth 2 (two) times daily.   No current facility-administered medications for this visit.   ALLERGIES: No Known Allergies  SOCIAL HISTORY: reports that he has never smoked. He  has never used smokeless tobacco. He reports that he drinks about 14.4 oz of alcohol per week He reports that he does not use illicit drugs.  FAMILY HISTORY: family history includes Alcohol abuse in his father; Coronary artery disease in his mother; Heart disease in his other; No Known Problems in his brother, daughter, sister, and son.  Review of Systems:  A complete 12 system ROS was performed. There were no constitutional, HEENT, respiratory, cardiovascular, GI, GU, musculoskeletal, skin neurologic, hematologic, or psychiatric complaints except as in HPI.  Objective:  BP 128/82 (BP Location: Left upper arm, Patient Position: Sitting, BP Cuff Size: Adult)  Pulse 68  Temp 36.6 C (97.9 F) (Oral)  Resp 20  Ht 172.7 cm (5\' 8" )  Wt (!) 101.7 kg (224 lb 3.3 oz)  SpO2 98%  BMI 34.09 kg/m2   Wt Readings from Last 3 Encounters:  01/13/16 (!) 101.7 kg (224 lb 3.3 oz)  01/02/16 (!) 103.9 kg (229 lb)  12/13/15 (!) 104 kg (229 lb 3.2 oz)   BP Readings from Last 3 Encounters:  01/13/16 128/82  01/02/16 144/73  12/13/15 157/87   BP  128/82 (BP Location: Left upper arm, Patient Position: Sitting, BP Cuff Size: Adult)  Pulse 68  Temp 36.6 C (97.9 F) (Oral)  Resp 20  Ht 172.7 cm (5\' 8" )  Wt (!) 101.7 kg (224 lb 3.3 oz)  SpO2 98%  BMI 34.09 kg/m2  Is a well-appearing somewhat overweight gentleman but in no acute distress Neck 2+ Bilaterally carotid pulse, No bruit. JVP not elevated. Lungs Clear to auscultation, No wheezes, No rales, No rhonchi Cardiac regular rate and rhythm, normal S1/S2, No gallop, No rub, no murmur. Abdomen soft, non-tender, no hepatomegaly, no splenomegaly, Bowel sounds present Lower extremities No edema, good femoral pulses without bruits.  DP pulses: 2+ PT pulses: 2+ Radial pulses: 2+ Skin warm, dry, intact Neuro alert and oriented  Psych appropriate affect In no apparent distress  Last Lipids: Lab Results  Component Value Date  CHOLTOTAL 167 09/29/2015   LDLCALC 73 09/29/2015  HDL 63.7 09/29/2015  TRIG 153 09/29/2015   Chemistry   Component Value Date/Time  NA 139 11/29/2015 1031  NA 136 05/07/2014 0857  NA 138 10/08/2012 1240  K 4.0 11/29/2015 1031  K 3.9 05/07/2014 0857  K 3.9 10/08/2012 1240  CL 104 11/29/2015 1031  CL 101 05/07/2014 0857  CL 105 10/08/2012 1240  CO2 24.8 11/29/2015 1031  CO2 22 05/07/2014 0857  BUN 15 11/29/2015 1031  BUN 17 05/07/2014 0857  CREATININE 0.8 11/29/2015 1031  CREATININE 0.8 05/07/2014 0857  CREATININE 0.5 (L) 10/08/2012 1240   Component Value Date/Time  CALCIUM 9.4 11/29/2015 1031  CALCIUM 9.5 05/07/2014 0857  CALCIUM 9.6 10/08/2012 1240  ALKPHOS 41 11/29/2015 1031  ALKPHOS 46 05/07/2014 0857  ALKPHOS 45 10/08/2012 1240  AST 17 11/29/2015 1031  AST 24 05/07/2014 0857  AST 22 10/08/2012 1240  ALT 18 11/29/2015 1031  ALT 25 05/07/2014 0857  ALT 25 10/08/2012 1240    CLINICAL DATA REVIEW   Orders Placed This Encounter  Procedures  . ECG 12-lead  . Echo complete   ASSESSMENT / PLAN:   Pure hypercholesterolemia (primary encounter diagnosis): He is on 35 of Lipitor. I would increase this to 80. He is agreeable with this his LFTs all look normal.  Coronary artery disease involving native coronary artery of native heart without angina pectoris: He is not currently having any angina. I think medical therapy remains reasonable. I would like to assess his ejection fraction. We will get an echo next clinic visit as he does not want to return unless if if he can avoid returning for another visit. Plan: ECG 12-lead, Echo complete  Essential hypertension: His blood pressure appears well-controlled.  In total greater than 45 minutes was required for this visit, including time or preparation, review of records and primary studies, and documentation.   Patient Instructions  It was good to see you again. Everything that we can tell about your heart looks fine. As long as you are doing fine  and not having any chest pain or shortness of breath and I will think would be pressed to do a lot of testing.  I did look at your heart catheterization films for many years ago. It is more more data that the lower we get your cholesterol the better so I would increase your Lipitor to 80 mg a day. Your liver looks fine from all the blood work that they got.  Otherwise we want you to stay active. It would be great if you could get in a regular walking program where you walk for 30  minutes straight. Although I know you are physically active at work I think remaining active and doing regular prolonged physical activity like walking is really important for a variety of reasons and will help you to decrease your weight. Exercise and regular activity is so important for many reasons: it is good for your heart, lungs, but also muscle tone, bone strength, co-ordination and sense of general well being. It even prevents dementia and improves thinking!  Finally I would like to get an ultrasound of your heart. I do not feel strongly about this but we have not done any tests and her heart many many years and I think it would be reassuring to at least know that your heart function is normal and that there are not other changes that we need to worry about. We can get this prior to her next appointment.

## 2016-03-13 NOTE — Pre-Procedure Instructions (Signed)
ECG 12-lead9/22/2017 [place] Component Name Value Ref Range  Vent Rate (bpm) 65   PR Interval (msec) 124   QRS Interval (msec) 86   QT Interval (msec) 388   QTc (msec) 403   Result Narrative  Normal sinus rhythm Nonspecific T wave abnormalities Abnormal ECG  When compared with ECG of 12-Feb-2007 16:38, Nonspecific T wave abnormalities are now present I reviewed and concur with this report. Electronically signed [id number], MD, Eddie Dibbles [phone]) on 01/13/2016 5:43:50 PM  Status Results Details    Initial consult on 01/13/2016 [place]")' [email or web address] Summary

## 2016-03-13 NOTE — Patient Instructions (Addendum)
  Your procedure is scheduled on: 03-22-16 Report to Same Day Surgery 2nd floor medical mall To find out your arrival time please call [phone] between 1PM - 3PM on 03-21-16  Remember: Instructions that are not followed completely may result in serious medical risk, up to and including death, or upon the discretion of your surgeon and anesthesiologist your surgery may need to be rescheduled.    _x___ 1. Do not eat food or drink liquids after midnight. No gum chewing or hard candies.     __x__ 2. No Alcohol for 24 hours before or after surgery.   __x__3. No Smoking for 24 prior to surgery.   ____  4. Bring all medications with you on the day of surgery if instructed.    __x__ 5. Notify your doctor if there is any change in your medical condition     (cold, fever, infections).     Do not wear jewelry, make-up, hairpins, clips or nail polish.  Do not wear lotions, powders, or perfumes. You may wear deodorant.  Do not shave 48 hours prior to surgery. Men may shave face and neck.  Do not bring valuables to the hospital.    [facility] is not responsible for any belongings or valuables.               Contacts, dentures or bridgework may not be worn into surgery.  Leave your suitcase in the car. After surgery it may be brought to your room.  For patients admitted to the hospital, discharge time is determined by your treatment team.   Patients discharged the day of surgery will not be allowed to drive home.    Please read over the following fact sheets that you were given:   [facility] Preparing for Surgery and or MRSA Information   _x___ Take these medicines the morning of surgery with A SIP OF WATER:    1. PRILOSEC  2. TAKE AN EXTRA PRILOSEC Wednesday NIGHT BEFORE BED (03-21-16)  3.  4.  5.  6.  _X___Fleets enema or Magnesium Citrate as directed-FLEETS ENEMA AT HOME 1 HOUR PRIOR TO [place]   ____ Use CHG Soap or sage wipes as directed on instruction sheet    ____ Use inhalers on the day of surgery and bring to hospital day of surgery  ____ Stop metformin 2 days prior to surgery    ____ Take 1/2 of usual insulin dose the night before surgery and none on the morning of surgery.   ____ Stop aspirin or coumadin, or plavix-OK TO CONTINUE 81 MG ASA-DO NOT TAKE AM OF SURGERY  __ Stop Anti-inflammatories such as Advil, Aleve, Ibuprofen, Motrin, Naproxen,          Naprosyn, Goodies powders or aspirin products. Ok to take Tylenol.   __X__ Stop supplements until after surgery-STOP FISH OIL NOW   ____ Bring C-Pap to the hospital.

## 2016-03-22 ENCOUNTER — Ambulatory Visit: Payer: BC Managed Care – PPO | Admitting: Certified Registered"

## 2016-03-22 ENCOUNTER — Encounter: Payer: Self-pay | Admitting: *Deleted

## 2016-03-22 ENCOUNTER — Encounter
Admission: RE | Disposition: A | Discharge status: Home / Self Care | Payer: Self-pay | Source: Ambulatory Visit | Attending: General Surgery

## 2016-03-22 ENCOUNTER — Ambulatory Visit
Admission: RE | Admit: 2016-03-22 | Discharge: 2016-03-22 | Disposition: A | Discharge status: Home / Self Care | Payer: BC Managed Care – PPO | Source: Ambulatory Visit | Attending: General Surgery | Admitting: General Surgery

## 2016-03-22 DIAGNOSIS — K219 Gastro-esophageal reflux disease without esophagitis: Secondary | ICD-10-CM | POA: Diagnosis not present

## 2016-03-22 DIAGNOSIS — K633 Ulcer of intestine: Secondary | ICD-10-CM | POA: Insufficient documentation

## 2016-03-22 DIAGNOSIS — K621 Rectal polyp: Secondary | ICD-10-CM | POA: Diagnosis not present

## 2016-03-22 DIAGNOSIS — I10 Essential (primary) hypertension: Secondary | ICD-10-CM | POA: Diagnosis not present

## 2016-03-22 DIAGNOSIS — I252 Old myocardial infarction: Secondary | ICD-10-CM | POA: Insufficient documentation

## 2016-03-22 DIAGNOSIS — I2510 Atherosclerotic heart disease of native coronary artery without angina pectoris: Secondary | ICD-10-CM | POA: Diagnosis not present

## 2016-03-22 DIAGNOSIS — K760 Fatty (change of) liver, not elsewhere classified: Secondary | ICD-10-CM | POA: Insufficient documentation

## 2016-03-22 DIAGNOSIS — K648 Other hemorrhoids: Secondary | ICD-10-CM | POA: Diagnosis present

## 2016-03-22 DIAGNOSIS — E785 Hyperlipidemia, unspecified: Secondary | ICD-10-CM | POA: Diagnosis not present

## 2016-03-22 HISTORY — PX: HEMORRHOID SURGERY: SHX153

## 2016-03-22 SURGERY — HEMORRHOIDECTOMY
Anesthesia: General | Wound class: Clean Contaminated

## 2016-03-22 MED ORDER — ONDANSETRON HCL 4 MG/2ML IJ SOLN: 4.0000 mg | Freq: Once | INTRAMUSCULAR | Status: DC | PRN

## 2016-03-22 MED ORDER — BUPIVACAINE-EPINEPHRINE (PF) 0.5% -1:200000 IJ SOLN
INTRAMUSCULAR | Status: AC
  Filled 2016-03-22: qty 30

## 2016-03-22 MED ORDER — LIDOCAINE HCL (CARDIAC) 20 MG/ML IV SOLN
INTRAVENOUS | Status: DC | PRN
  Administered 2016-03-22: 100 mg via INTRAVENOUS

## 2016-03-22 MED ORDER — BUPIVACAINE-EPINEPHRINE (PF) 0.5% -1:200000 IJ SOLN
INTRAMUSCULAR | Status: DC | PRN
  Administered 2016-03-22: 30 mL

## 2016-03-22 MED ORDER — HYDROCODONE-ACETAMINOPHEN 5-325 MG PO TABS
ORAL_TABLET | ORAL | Status: AC
  Filled 2016-03-22: qty 1

## 2016-03-22 MED ORDER — ONDANSETRON HCL 4 MG/2ML IJ SOLN
INTRAMUSCULAR | Status: DC | PRN
  Administered 2016-03-22: 4 mg via INTRAVENOUS

## 2016-03-22 MED ORDER — HYDROCODONE-ACETAMINOPHEN 5-325 MG PO TABS
1.0000 | ORAL_TABLET | Freq: Once | ORAL | Status: AC
  Administered 2016-03-22: 1 via ORAL

## 2016-03-22 MED ORDER — SUCCINYLCHOLINE CHLORIDE 20 MG/ML IJ SOLN
INTRAMUSCULAR | Status: DC | PRN
  Administered 2016-03-22: 100 mg via INTRAVENOUS

## 2016-03-22 MED ORDER — FENTANYL CITRATE (PF) 100 MCG/2ML IJ SOLN: 25.0000 ug | INTRAMUSCULAR | Status: DC | PRN

## 2016-03-22 MED ORDER — HYDROCODONE-ACETAMINOPHEN 5-325 MG PO TABS: 1.0000 | ORAL_TABLET | ORAL | 0 refills | Status: DC | PRN

## 2016-03-22 MED ORDER — FENTANYL CITRATE (PF) 100 MCG/2ML IJ SOLN
INTRAMUSCULAR | Status: DC | PRN
Start: 2016-03-22 — End: 2016-03-22
  Administered 2016-03-22: 100 ug via INTRAVENOUS
  Administered 2016-03-22 (×2): 25 ug via INTRAVENOUS

## 2016-03-22 MED ORDER — PROPOFOL 10 MG/ML IV BOLUS
INTRAVENOUS | Status: DC | PRN
Start: 2016-03-22 — End: 2016-03-22
  Administered 2016-03-22: 170 mg via INTRAVENOUS

## 2016-03-22 MED ORDER — MIDAZOLAM HCL 2 MG/2ML IJ SOLN
INTRAMUSCULAR | Status: DC | PRN
  Administered 2016-03-22: 2 mg via INTRAVENOUS

## 2016-03-22 MED ORDER — LACTATED RINGERS IV SOLN
INTRAVENOUS | Status: DC
  Administered 2016-03-22: 07:00:00 via INTRAVENOUS

## 2016-03-22 SURGICAL SUPPLY — 28 items
BRIEF STRETCH MATERNITY 2XLG (MISCELLANEOUS) ×3 IMPLANT
CANISTER SUCT 1200ML W/VALVE (MISCELLANEOUS) ×3 IMPLANT
DRAPE LAPAROTOMY 100X77 ABD (DRAPES) ×3 IMPLANT
DRAPE LEGGINS SURG 28X43 STRL (DRAPES) ×3 IMPLANT
DRAPE UNDER BUTTOCK W/FLU (DRAPES) ×3 IMPLANT
DRSG GAUZE PETRO 6X36 STRIP ST (GAUZE/BANDAGES/DRESSINGS) ×3 IMPLANT
ELECT REM PT RETURN 9FT ADLT (ELECTROSURGICAL) ×3
ELECTRODE REM PT RTRN 9FT ADLT (ELECTROSURGICAL) ×1 IMPLANT
GLOVE BIO SURGEON STRL SZ7.5 (GLOVE) ×6 IMPLANT
GLOVE INDICATOR 8.0 STRL GRN (GLOVE) ×6 IMPLANT
GOWN STRL REUS W/ TWL LRG LVL3 (GOWN DISPOSABLE) ×2 IMPLANT
GOWN STRL REUS W/TWL LRG LVL3 (GOWN DISPOSABLE) ×4
HARMONIC SCALPEL FOCUS (MISCELLANEOUS) ×3 IMPLANT
KIT RM TURNOVER STRD PROC AR (KITS) ×3 IMPLANT
LABEL OR SOLS (LABEL) ×3 IMPLANT
NDL SAFETY 22GX1.5 (NEEDLE) ×3 IMPLANT
NEEDLE HYPO 25X1 1.5 SAFETY (NEEDLE) ×3 IMPLANT
PACK BASIN MINOR ARMC (MISCELLANEOUS) ×3 IMPLANT
PAD OB MATERNITY 4.3X12.25 (PERSONAL CARE ITEMS) ×3 IMPLANT
PAD PREP 24X41 OB/GYN DISP (PERSONAL CARE ITEMS) ×3 IMPLANT
SOL PREP PVP 2OZ (MISCELLANEOUS) ×3
SOLUTION PREP PVP 2OZ (MISCELLANEOUS) ×1 IMPLANT
SURGILUBE 2OZ TUBE FLIPTOP (MISCELLANEOUS) ×3 IMPLANT
SUT CHROMIC 3 0 SH 27 (SUTURE) ×3 IMPLANT
SUT SILK 0 CT 1 30 (SUTURE) ×3 IMPLANT
SUT VIC AB 3-0 SH 27 (SUTURE) ×2
SUT VIC AB 3-0 SH 27X BRD (SUTURE) ×1 IMPLANT
SYR CONTROL 10ML (SYRINGE) ×6 IMPLANT

## 2016-03-22 NOTE — Op Note (Signed)
Preoperative diagnosis: Internal hemorrhoid with polyp.  Postoperative diagnosis: Same.  Operative procedure: Excision of single pile internal hemorrhoid with polyp.  Operating surgeon: Ollen Bowl, M.D.  Anesthesia: Gen. endotracheal, Marcaine 0.5% with 1-200,000 of epinephrine, 30 mL.  Estimated blood loss: 50 mL.  Clinical note: This 65 year old male underwent a screening colonoscopy and was noted to have a polyp at the top of a sizable internal hemorrhoid on the right posterior lateral wall of the rectum. He is admitted at this time for planned excision.  Operative note: With the patient under adequate general endotracheal anesthesia he was placed in dorsal lithotomy position. The perineum was prepped with Betadine solution and draped. Marcaine was infiltrated for postoperative analgesia and hemostasis. Anoscopy showed a large internal hemorrhoid with a 1 cm cherry red, mulberry-like polyp at the apex at the 7:00 position (right posterior lateral). The area was excised making use of the Harmonic scalpel. Hemostasis at the superior aspect was with a 3-0 Vicryl figure-of-eight suture. The mucosa was reapproximated with a running 3-0 chromic suture. A Vaseline gauze pack was placed for final hemostasis. The patient was taken to recovery in stable condition.

## 2016-03-22 NOTE — H&P (Signed)
External anal polyp excision. Small sip of water at 3 AM. For excision today.

## 2016-03-22 NOTE — Anesthesia Procedure Notes (Signed)
Procedure Name: Intubation Performed by: Lance Muss Pre-anesthesia Checklist: Patient identified, Patient being monitored, Timeout performed, Emergency Drugs available and Suction available Patient Re-evaluated:Patient Re-evaluated prior to inductionOxygen Delivery Method: Circle system utilized Preoxygenation: Pre-oxygenation with 100% oxygen Intubation Type: IV induction Ventilation: Mask ventilation without difficulty and Oral airway inserted - appropriate to patient size Laryngoscope Size: Mac and 3 Grade View: Grade II Tube type: Oral Tube size: 7.5 mm Number of attempts: 1 Airway Equipment and Method: Stylet and Bougie stylet Placement Confirmation: ETT inserted through vocal cords under direct vision,  positive ETCO2 and breath sounds checked- equal and bilateral Secured at: 21 cm Tube secured with: Tape Dental Injury: Teeth and Oropharynx as per pre-operative assessment  Difficulty Due To: Difficult Airway- due to anterior larynx Future Recommendations: Recommend- induction with short-acting agent, and alternative techniques readily available Comments: Recommend using videolaryngoscopy in future

## 2016-03-22 NOTE — Discharge Instructions (Signed)

## 2016-03-22 NOTE — Anesthesia Preprocedure Evaluation (Signed)
Anesthesia Evaluation  Patient identified by MRN, date of birth, ID band Patient awake    Reviewed: Allergy & Precautions, NPO status , Patient's Chart, lab work & pertinent test results  History of Anesthesia Complications (+) Emergence DeliriumNegative for: history of anesthetic complications  Airway Mallampati: II  TM Distance: >3 FB Neck ROM: Full    Dental  (+) Partial Lower   Pulmonary neg pulmonary ROS, neg sleep apnea, neg COPD,    breath sounds clear to auscultation- rhonchi (-) wheezing      Cardiovascular hypertension, Pt. on medications (-) angina+ CAD and + Past MI (1999)  (-) Cardiac Stents and (-) CABG  Rhythm:Regular Rate:Normal - Systolic murmurs and - Diastolic murmurs  Cardiac Cath [facility] 01/2007 RCA PDA 90%. D1 80%. D2 80%. Normal EF. 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 No date: Hyperlipidemia No date: Hypertension No date: Myocardial infarction   Reproductive/Obstetrics                             Anesthesia Physical  Anesthesia Plan  ASA: III  Anesthesia Plan: General   Post-op Pain Management:    Induction: Intravenous  Airway Management Planned: Oral ETT  Additional Equipment:   Intra-op Plan:   Post-operative Plan: Extubation in OR  Informed Consent: I have reviewed the  patients History and Physical, chart, labs and discussed the procedure including the risks, benefits and alternatives for the proposed anesthesia with the patient or authorized representative who has indicated his/her understanding and acceptance.   Dental advisory given  Plan Discussed with: CRNA and Anesthesiologist  Anesthesia Plan Comments:         Anesthesia Quick Evaluation

## 2016-03-22 NOTE — Transfer of Care (Signed)
Immediate Anesthesia Transfer of Care Note  Patient: Ricky Stewart  Procedure(s) Performed: Procedure(s): HEMORRHOIDECTOMY, ANAL POLYP EXCISION (N/A)  Patient Location: PACU  Anesthesia Type:General  Level of Consciousness: awake, alert  and oriented  Airway & Oxygen Therapy: Patient Spontanous Breathing and Patient connected to face mask oxygen  Post-op Assessment: Report given to RN and Post -op Vital signs reviewed and stable  Post vital signs: Reviewed and stable  Last Vitals:  Vitals:   03/22/16 0615 03/22/16 0814  BP: (!) 149/80 (!) 169/95  Pulse: (!) 58 74  Resp: 20 17  Temp: 36.7 C     Last Pain:  Vitals:   03/22/16 0615  TempSrc: Oral         Complications: No apparent anesthesia complications

## 2016-03-23 ENCOUNTER — Telehealth: Payer: Self-pay

## 2016-03-23 LAB — SURGICAL PATHOLOGY

## 2016-03-23 NOTE — Telephone Encounter (Signed)
Notified patient as instructed, patient agrees.  

## 2016-03-23 NOTE — Telephone Encounter (Signed)
-----   Message from [name], MD sent at 03/23/2016 10:27 AM EST ----- Please notify the patient that the polyp\ and hemorrhoid were benign.  No cancer. F/U as scheduled.  ----- Message ----- From: Interface, Lab In Three Zero One Sent: 03/23/2016   9:32 AM To: [name], MD

## 2016-03-24 NOTE — Anesthesia Postprocedure Evaluation (Signed)
Anesthesia Post Note  Patient: Ricky Stewart  Procedure(s) Performed: Procedure(s) (LRB): HEMORRHOIDECTOMY, ANAL POLYP EXCISION (N/A)  Patient location during evaluation: PACU Anesthesia Type: General Level of consciousness: awake and alert and oriented Pain management: pain level controlled Vital Signs Assessment: post-procedure vital signs reviewed and stable Respiratory status: spontaneous breathing Cardiovascular status: blood pressure returned to baseline Anesthetic complications: no    Last Vitals:  Vitals:   03/22/16 0854 03/22/16 0905  BP: (!) 169/85 (!) 172/93  Pulse: 62 61  Resp: 12 18  Temp: 36.3 C 36.5 C    Last Pain:  Vitals:   03/23/16 0907  TempSrc:   PainSc: 2                  Harrison Zetina

## 2016-04-03 ENCOUNTER — Ambulatory Visit: Payer: BC Managed Care – PPO | Admitting: General Surgery

## 2016-04-05 ENCOUNTER — Ambulatory Visit (INDEPENDENT_AMBULATORY_CARE_PROVIDER_SITE_OTHER): Payer: BC Managed Care – PPO | Admitting: General Surgery

## 2016-04-05 ENCOUNTER — Encounter: Payer: Self-pay | Admitting: General Surgery

## 2016-04-05 VITALS — BP 148/82 | HR 80 | Resp 16 | Ht 68.0 in | Wt 225.0 lb

## 2016-04-05 DIAGNOSIS — K620 Anal polyp: Secondary | ICD-10-CM

## 2016-04-05 DIAGNOSIS — A048 Other specified bacterial intestinal infections: Secondary | ICD-10-CM

## 2016-04-05 NOTE — Progress Notes (Signed)
Patient ID: Ricky Stewart, male   DOB: 06/01/1950, 65 y.o.   MRN: [id number]  Chief Complaint  Patient presents with  . Follow-up    HPI Ricky Stewart is a 65 y.o. male.  Here today for postoperative visit Having undergone excision of a hemorrhoid with a prominent polypoid area.. Bowels are back to normal.   The patient was treated with a PPI, Biaxin and amoxicillin for his documented H. pylori. He reported taking all of the amoxicillin, with thought he was having a reaction to the Biaxin and discontinue this after about 5 days.   HPI  Past Medical History:  Diagnosis Date  . Arthralgia   . Arthritis   . Colon polyp   . Coronary artery disease   . ED (erectile dysfunction)   . Fatty liver   . Gastritis   . GERD (gastroesophageal reflux disease)   . Heart attack    18 years ago.  Marland Kitchen Helicobacter pylori infection   . Hepatic vein stenosis   . History of kidney stones   . Hyperlipidemia   . Hypertension   . Myocardial infarction     Past Surgical History:  Procedure Laterality Date  . COLONOSCOPY WITH PROPOFOL N/A 02/20/2016   Procedure: COLONOSCOPY WITH PROPOFOL;  Surgeon: Manya Silvas, MD;  Location: [facility] ENDOSCOPY;  Service: Endoscopy;  Laterality: N/A;  . ESOPHAGOGASTRODUODENOSCOPY (EGD) WITH PROPOFOL N/A 02/20/2016   Procedure: ESOPHAGOGASTRODUODENOSCOPY (EGD) WITH PROPOFOL;  Surgeon: Manya Silvas, MD;  Location: [facility] ENDOSCOPY;  Service: Endoscopy;  Laterality: N/A;  . HEMORRHOID SURGERY N/A 03/22/2016   Procedure: HEMORRHOIDECTOMY, ANAL POLYP EXCISION;  Surgeon: [name], MD;  Location: [facility] ORS;  Service: General;  Laterality: N/A;  . PILONIDAL CYST EXCISION      No family history on file.  Social History Social History  Substance Use Topics  . Smoking status: Never Smoker  . Smokeless tobacco: Never Used  . Alcohol use 0.6 oz/week    1 Cans of beer per week     Comment: RED WINE [facility]    Allergies  Allergen Reactions  . Biaxin  [Clarithromycin] Swelling    TONGUE SWELLING- "FELT LIKE I HAD BURNED MY TONGUE"    Current Outpatient Prescriptions  Medication Sig Dispense Refill  . aspirin EC 81 MG tablet Take 81 mg by mouth every evening.     Marland Kitchen atorvastatin (LIPITOR) 80 MG tablet Take 80 mg by mouth daily at 2 PM.    . ibuprofen (ADVIL,MOTRIN) 200 MG tablet Take 400 mg by mouth daily as needed for headache or moderate pain.    . Ibuprofen-Diphenhydramine Cit (IBUPROFEN PM PO) Take 1 tablet by mouth daily as needed (sleep).    . losartan (COZAAR) 100 MG tablet Take 100 mg by mouth every evening.     . niacin 500 MG tablet Take 1,000 mg by mouth 2 (two) times daily.    . Omega-3 Fatty Acids (FISH OIL PO) Take 1 capsule by mouth daily.     Marland Kitchen omeprazole (PRILOSEC) 40 MG capsule Take 1 capsule (40 mg total) by mouth 2 (two) times daily. (Patient taking differently: Take 40 mg by mouth every morning. ) 60 capsule 0  . sucralfate (CARAFATE) 1 g tablet Take 1 g by mouth 2 (two) times daily before a meal.     No current facility-administered medications for this visit.     Review of Systems Review of Systems  Constitutional: Negative.   Respiratory: Negative.   Cardiovascular: Negative.  Blood pressure (!) 148/82, pulse 80, resp. rate 16, height 5\' 8"  (1.727 m), weight 225 lb (102.1 kg).  Physical Exam Physical Exam  Constitutional: He appears well-developed and well-nourished.  Genitourinary:  Genitourinary Comments: Rectal skin tag, surgical area healing.  Neurological: He is alert.  Skin: Skin is warm and dry.  Psychiatric: His behavior is normal.    Data Reviewed A. HEMORRHOID, RIGHT POSTERIOR LATERAL; HEMORRHOIDECTOMY:  - HEMORRHOID.  - POLYPOID COLONIC MUCOSA WITH ULCERATION AND REGENERATIVE MUCOSAL  CHANGES.  - NEGATIVE FOR DYSPLASIA AND MALIGNANCY.   Assessment    Doing well status post excision of a hemorrhoid with traumatized mucosa.  Likely satisfactory treatment for his H. pylori.     Plan    The patient should be following up next month with the GI department. They can determine if a stool test for H. pylori or breath test would be appropriate to confirm adequate treatment.    Follow up as needed.   This information has been scribed by Karie Fetch RN, BSN,BC.   [name] 04/06/2016, 5:46 PM

## 2016-04-05 NOTE — Patient Instructions (Signed)
The patient is aware to call back for any questions or concerns.  

## 2016-04-24 ENCOUNTER — Other Ambulatory Visit: Payer: Self-pay | Admitting: Nurse Practitioner

## 2016-04-24 DIAGNOSIS — K766 Portal hypertension: Secondary | ICD-10-CM

## 2016-04-24 DIAGNOSIS — K760 Fatty (change of) liver, not elsewhere classified: Secondary | ICD-10-CM

## 2016-04-24 DIAGNOSIS — K3189 Other diseases of stomach and duodenum: Secondary | ICD-10-CM

## 2016-05-01 ENCOUNTER — Ambulatory Visit
Admission: RE | Admit: 2016-05-01 | Discharge: 2016-05-01 | Disposition: A | Discharge status: Home / Self Care | Payer: BLUE CROSS/BLUE SHIELD | Source: Ambulatory Visit | Attending: Nurse Practitioner | Admitting: Nurse Practitioner

## 2016-05-01 DIAGNOSIS — K766 Portal hypertension: Secondary | ICD-10-CM

## 2016-05-01 DIAGNOSIS — K760 Fatty (change of) liver, not elsewhere classified: Secondary | ICD-10-CM | POA: Diagnosis present

## 2016-05-01 DIAGNOSIS — K3189 Other diseases of stomach and duodenum: Secondary | ICD-10-CM | POA: Diagnosis not present

## 2016-08-31 IMAGING — CT CT ABD-PELV W/ CM
1 of 3 series · 13 of 32 positions shown, 18 images · IV contrast (APPLIED)
Comparison: No priors.

CLINICAL DATA: 65-year-old male with worsening bowel movements, I
gas pain and intermittent bloating over the past year. Lower
abdominal discomfort.

EXAM:
CT ABDOMEN AND PELVIS WITH CONTRAST
TECHNIQUE: Multidetector CT imaging of the abdomen and pelvis was performed
using the standard protocol following bolus administration of
intravenous contrast.
CONTRAST:  100mL 46GPPH-4GG IOPAMIDOL (46GPPH-4GG) INJECTION 61%

[Series 2: axial st · axial · 0.87mm/px · z∈[-969,-514]mm · 13 of 103 slices shown, 18 images]
[im 6/103  soft-tissue]
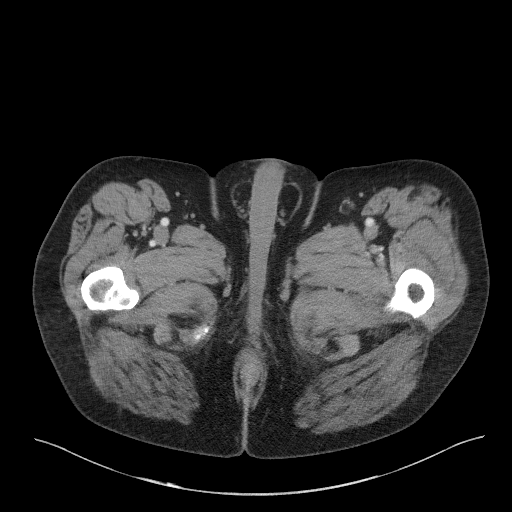
[im 6/103  bone]
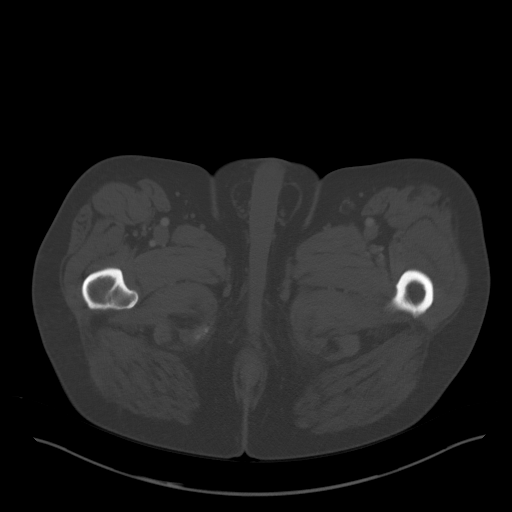
[im 18/103  soft-tissue]
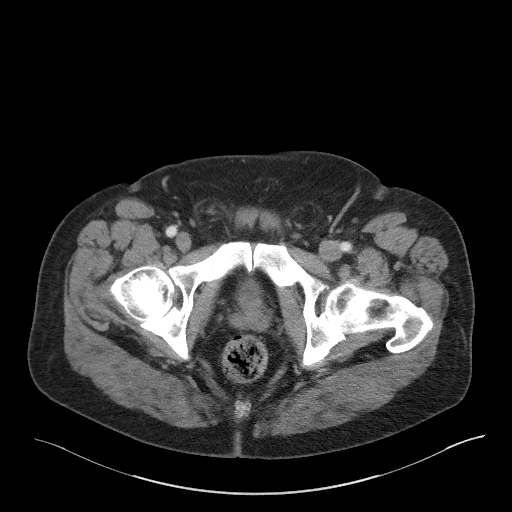
[im 23/103  soft-tissue]
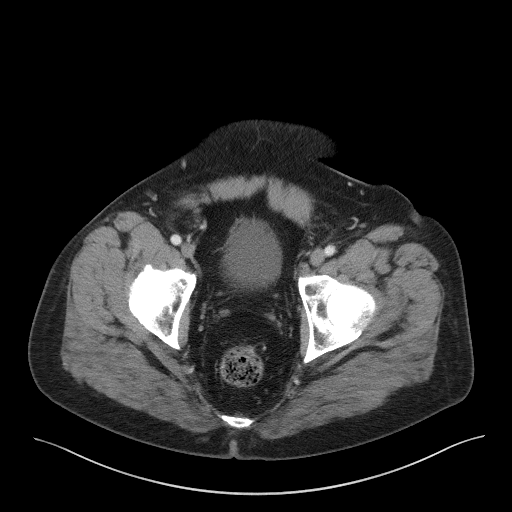
[im 29/103  soft-tissue]
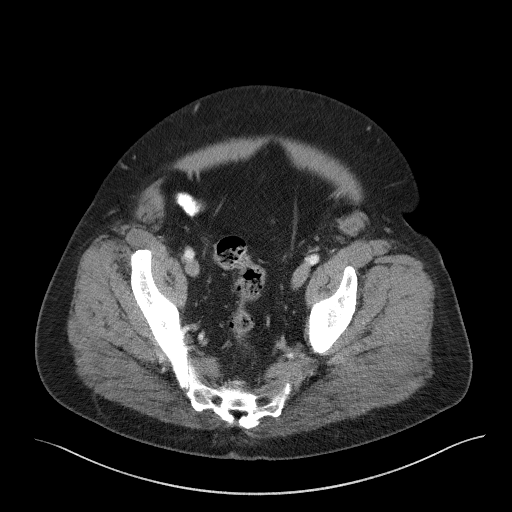
[im 40/103  soft-tissue]
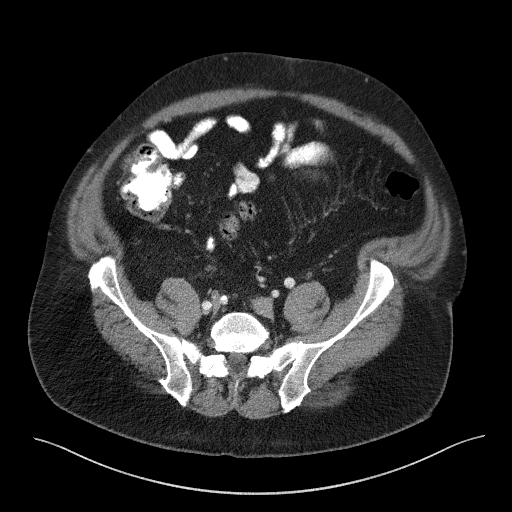
[im 46/103  soft-tissue]
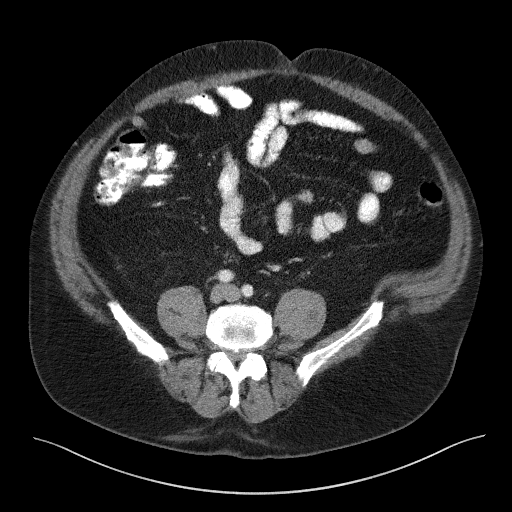
[im 57/103  soft-tissue]
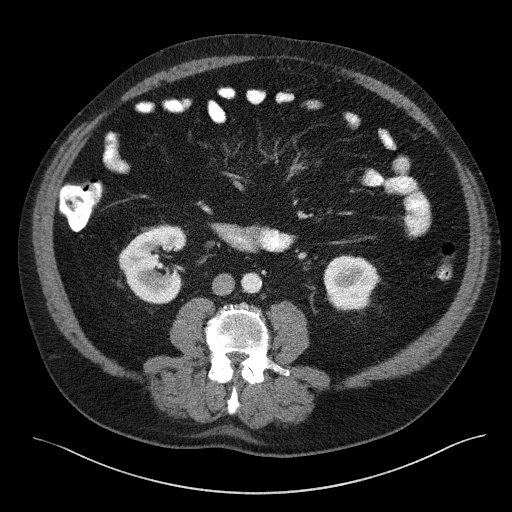
[im 63/103  soft-tissue]
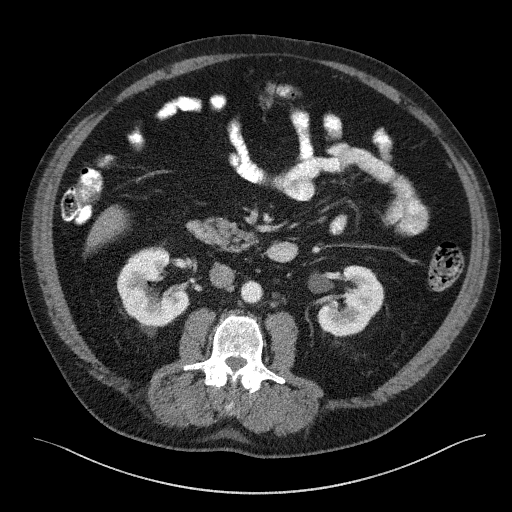
[im 74/103  soft-tissue]
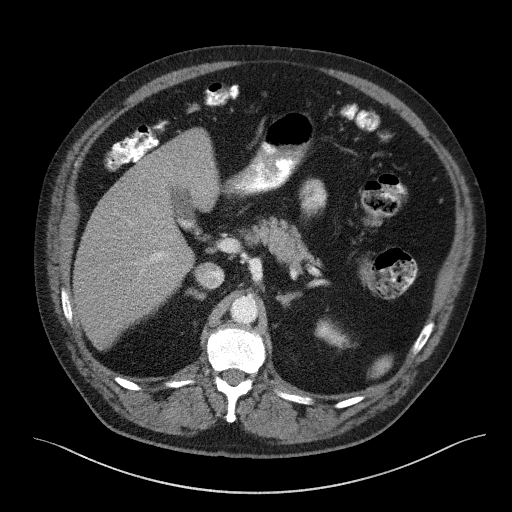
[im 74/103  bone]
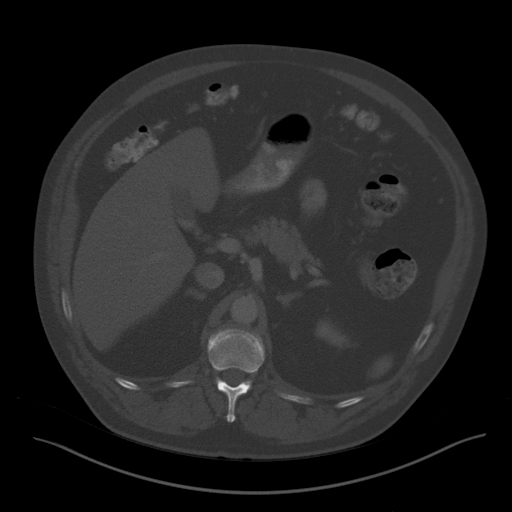
[im 80/103  soft-tissue]
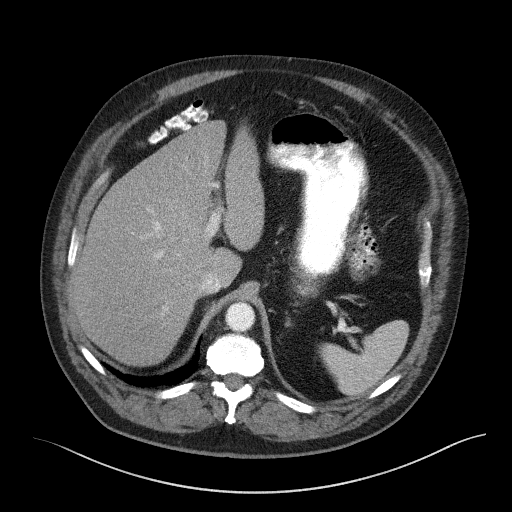
[im 80/103  lung]
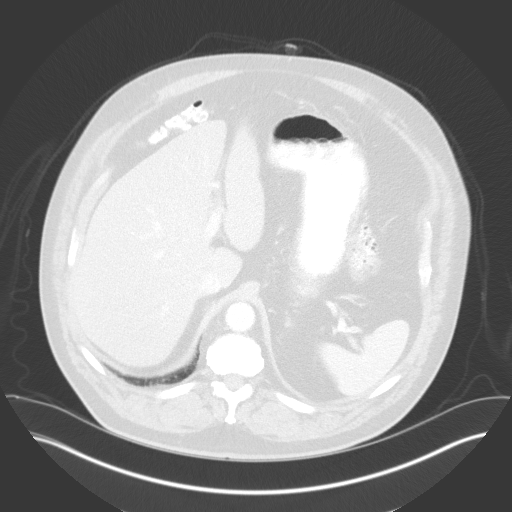
[im 86/103  soft-tissue]
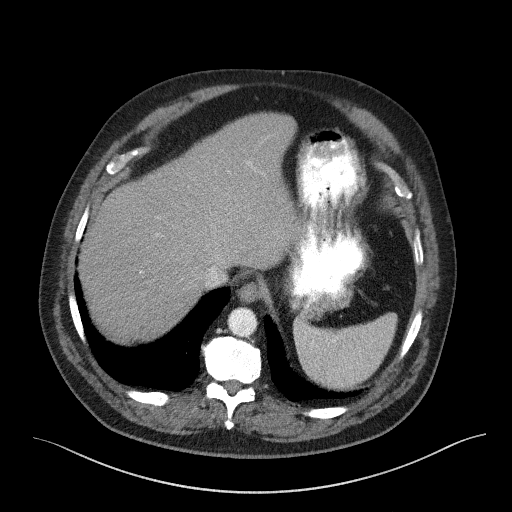
[im 86/103  lung]
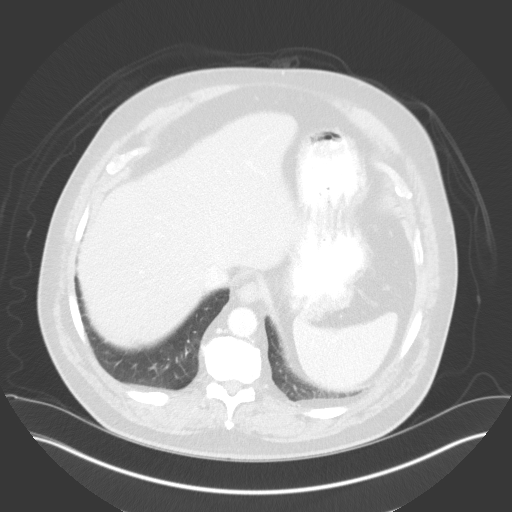
[im 91/103  lung]
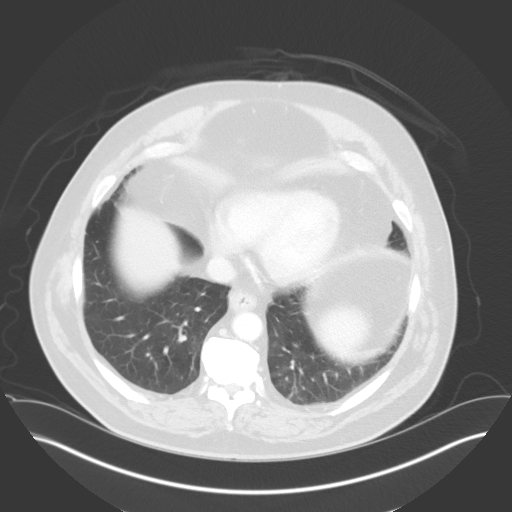
[im 97/103  soft-tissue]
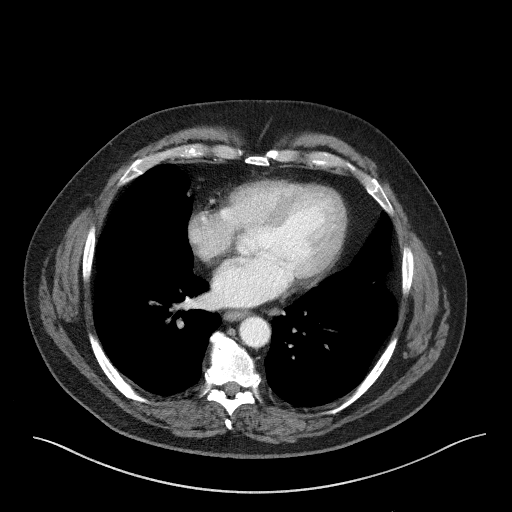
[im 97/103  lung]
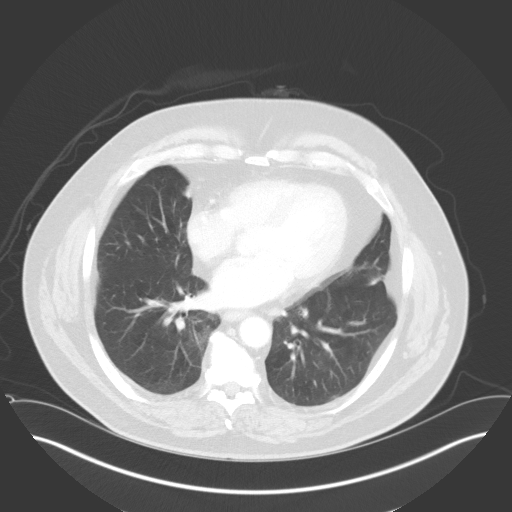

[13 of 32 positions shown; findings below may reference images not displayed]

FINDINGS: Lower chest: Atherosclerotic plaque in the proximal ascending
thoracic aorta and in the right coronary artery.

Hepatobiliary: Diffuse low attenuation throughout the hepatic
parenchyma, compatible with hepatic steatosis. No cystic or solid
hepatic lesions. No intra or extrahepatic biliary ductal dilatation.
Dependent high attenuation material in the gallbladder may reflect
biliary sludge and/or tiny gallstones. No findings to suggest an
acute cholecystitis at this time.

Pancreas: No pancreatic mass. No pancreatic ductal dilatation. No
pancreatic or peripancreatic fluid or inflammatory changes.

Spleen: Unremarkable.

Adrenals/Urinary Tract: Bilateral kidneys and bilateral adrenal
glands are normal in appearance. No hydroureteronephrosis. Urinary
bladder is normal in appearance.

Stomach/Bowel: The stomach is normal in appearance. No pathologic
dilatation of small bowel or colon. No focal areas of mural
thickening or surrounding inflammatory changes associated with
either the small bowel or colon. Normal appendix.

Vascular/Lymphatic: Aortic atherosclerosis, without evidence of
aneurysm or dissection in the abdominal or pelvic vasculature.

Reproductive: Prostate gland and seminal vesicles are unremarkable
in appearance.

Other: No significant volume of ascites.  No pneumoperitoneum.

Musculoskeletal: There are no aggressive appearing lytic or blastic
lesions noted in the visualized portions of the skeleton.
IMPRESSION: 1. No acute findings in the abdomen or pelvis to account for the
patient's symptoms.
2. Normal appendix.
3. Hepatic steatosis.
4. Cholelithiasis and/or biliary sludge lying dependently in the
gallbladder. No findings to suggest an acute cholecystitis at this
time.
5. Aortic atherosclerosis, in addition to right coronary artery
disease. Please note that although the presence of coronary artery
calcium documents the presence of coronary artery disease, the
severity of this disease and any potential stenosis cannot be
assessed on this non-gated CT examination. Assessment for potential
risk factor modification, dietary therapy or pharmacologic therapy
may be warranted, if clinically indicated.

## 2018-04-16 IMAGING — US US ABDOMEN COMPLETE W/ ELASTOGRAPHY
1 series · 12 of 25 positions shown · non-contrast
Comparison: 11/30/2015 CT abdomen/ pelvis.

CLINICAL DATA: Hepatic steatosis.  Portal hypertension.



[Series 1: us abdomen complete w/ elastography · 0.22mm/px · 12 of 117 slices shown]
[im 5/117]
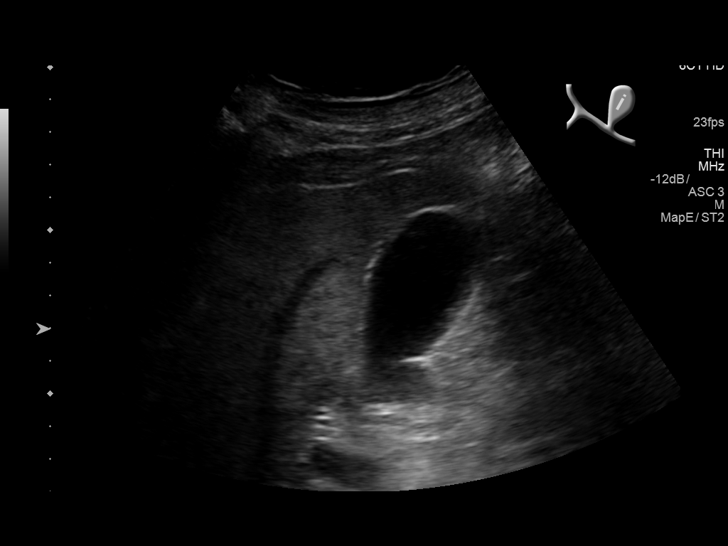
[im 15/117]
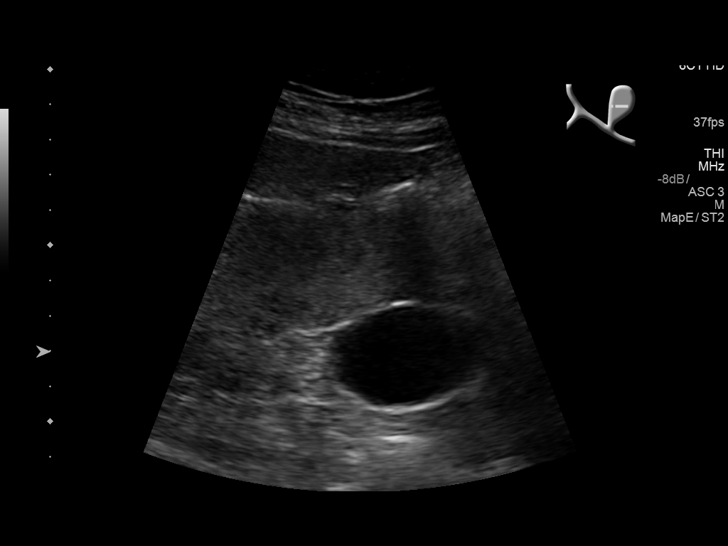
[im 25/117]
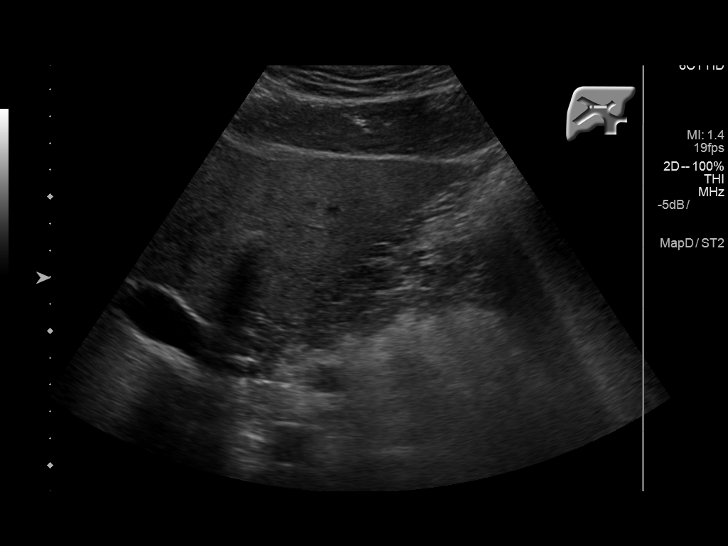
[im 34/117]
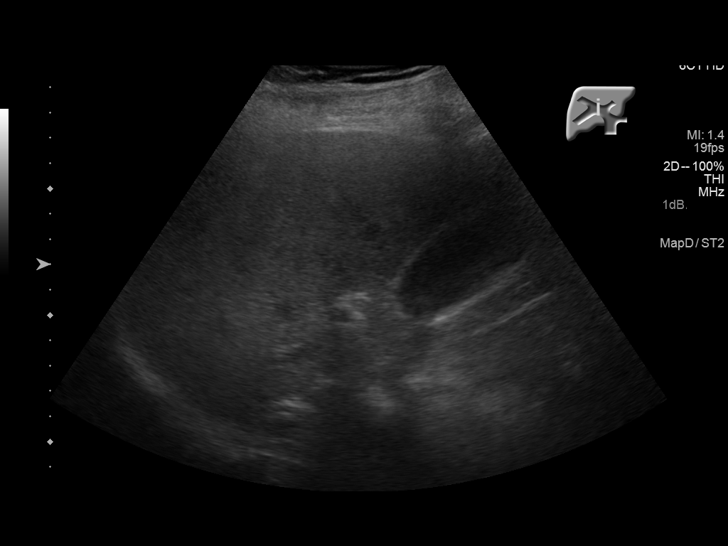
[im 44/117]
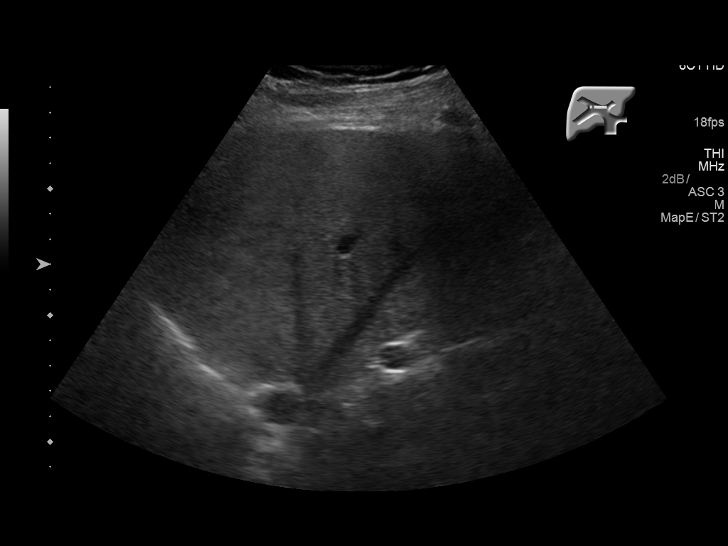
[im 54/117]
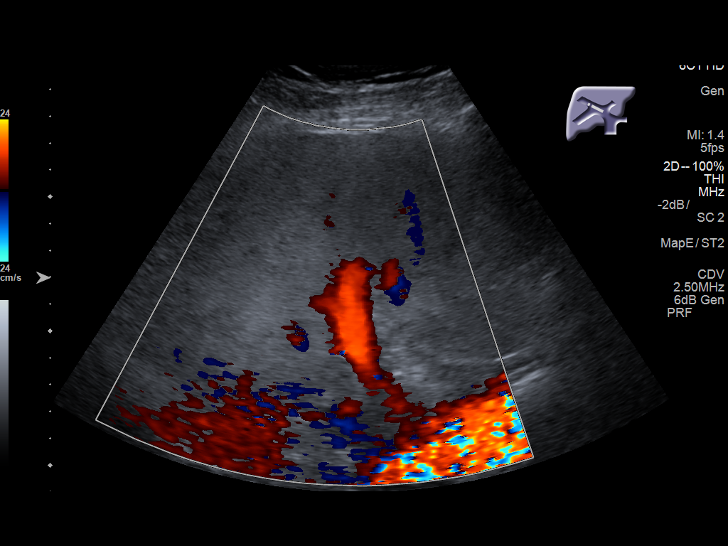
[im 63/117]
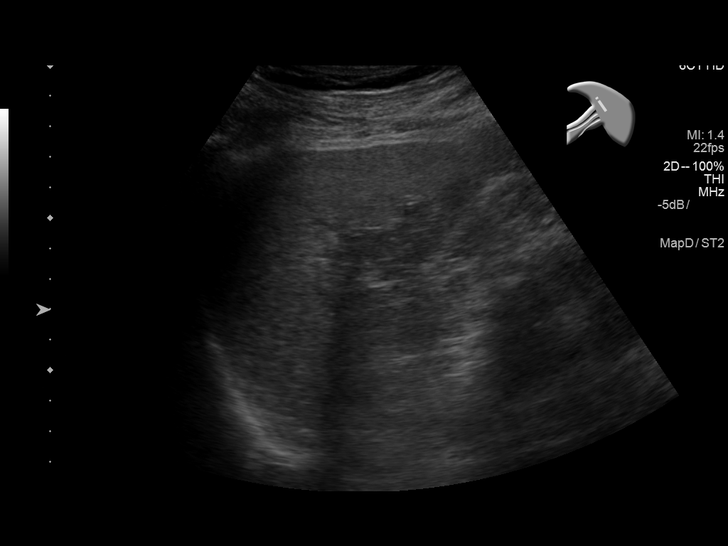
[im 73/117]
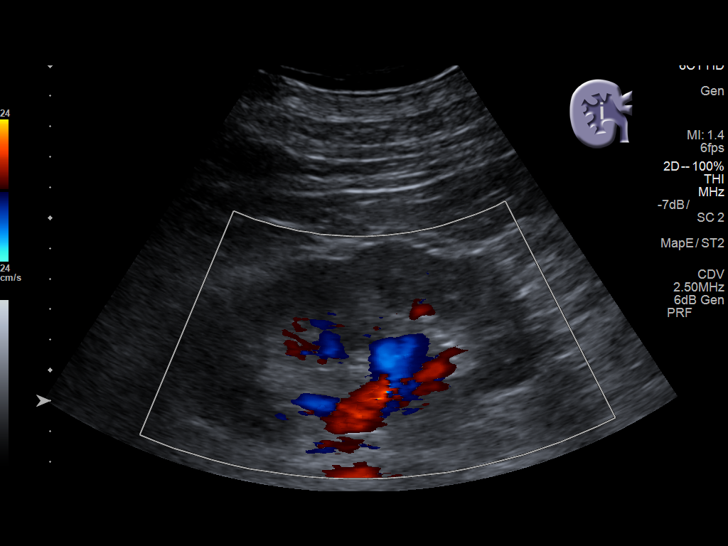
[im 83/117]
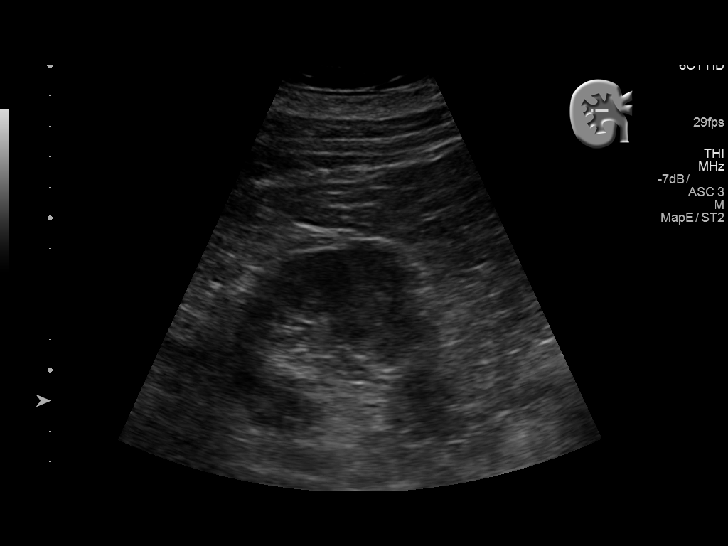
[im 92/117]
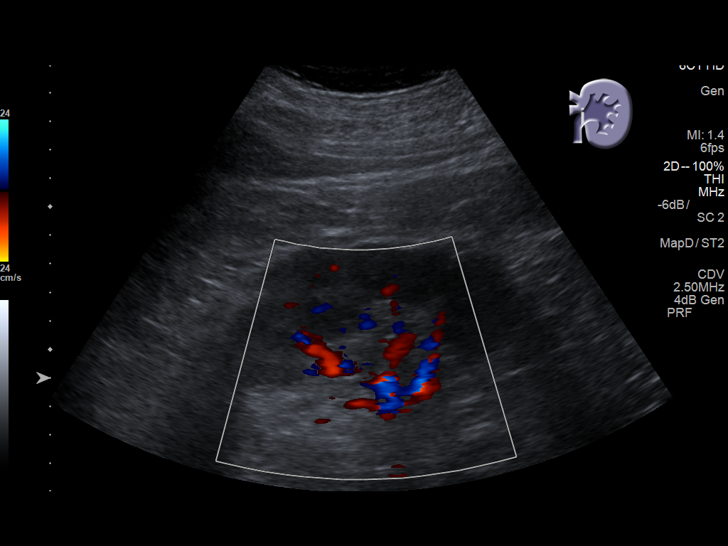
[im 102/117]
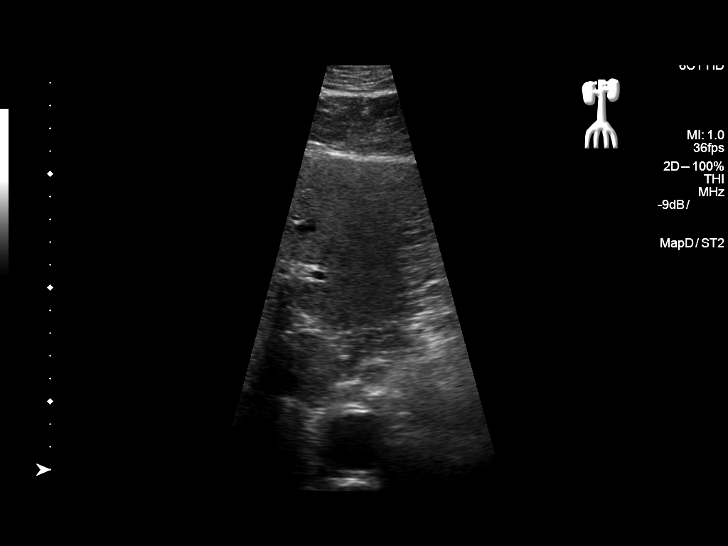
[im 112/117]
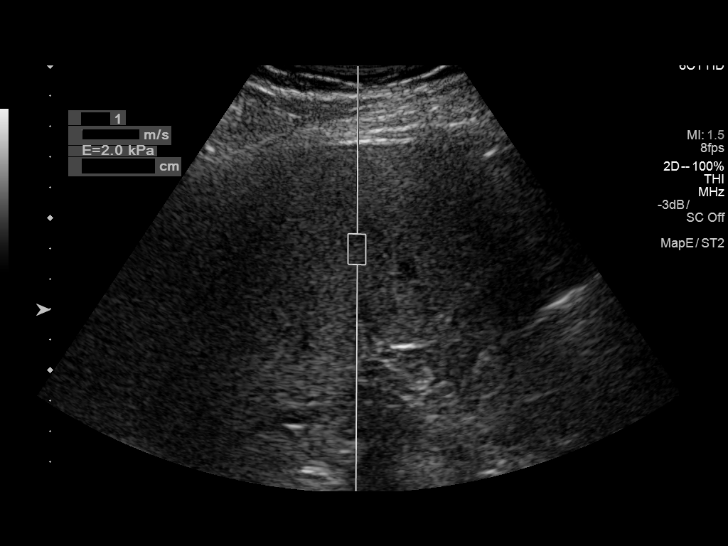

[12 of 25 positions shown; findings below may reference images not displayed]

FINDINGS: ULTRASOUND ABDOMEN

Gallbladder: There is a small amount of layering sludge and/ or tiny
stones in the nondistended gallbladder, with no gallbladder wall
thickening, pericholecystic fluid or sonographic Murphy's sign.

Common bile duct: Diameter: 5 mm

Liver: Liver parenchyma is diffusely mildly echogenic with minimal
posterior acoustic attenuation, consistent with mild diffuse hepatic
steatosis. No definite liver surface irregularity demonstrated. No
liver mass detected, noting decreased sensitivity in the setting of
an echogenic liver.

IVC: No abnormality visualized.

Pancreas: Visualized portion unremarkable.

Spleen: Size and appearance within normal limits.

Right Kidney: Length: 12.6 cm. Echogenicity within normal limits. No
mass or hydronephrosis visualized.

Left Kidney: Length: 11.6 cm. Echogenicity within normal limits. No
mass or hydronephrosis visualized.

Abdominal aorta: No aneurysm visualized.

Other findings: None.

ULTRASOUND HEPATIC ELASTOGRAPHY

Device: Siemens Helix VTQ

Patient position: Supine

Transducer [id number]

Number of measurements: 10

Hepatic segment:  8

Median velocity:   0.60  m/sec

IQR:

IQR/Median velocity ratio:

Corresponding Metavir fibrosis score:  F0/F1

Risk of fibrosis: Minimal

Limitations of exam: None

Pertinent findings noted on other imaging exams:  None

Please note that abnormal shear wave velocities may also be
identified in clinical settings other than with hepatic fibrosis,
such as: acute hepatitis, elevated right heart and central venous
pressures including use of beta blockers, Montalbo disease
(Nivirus), infiltrative processes such as
mastocytosis/amyloidosis/infiltrative tumor, extrahepatic
cholestasis, in the post-prandial state, and liver transplantation.
Correlation with patient history, laboratory data, and clinical
condition recommended.
IMPRESSION: ULTRASOUND ABDOMEN:

1. Mild diffuse hepatic steatosis. No macroscopic evidence of
cirrhosis. No liver mass.
2. No secondary findings of portal hypertension.
3. Minimal sludge and/or tiny gallstones in the gallbladder, with no
evidence of acute cholecystitis and no biliary ductal dilatation.

ULTRASOUND HEPATIC ELASTOGRAPHY:

Median hepatic shear wave velocity is calculated at 0.60 m/sec.

Corresponding Metavir fibrosis score is  F0/F1.

Risk of fibrosis is Minimal.

Follow-up: None required.
